# Patient Record
Sex: Female | Born: 1965 | Race: White | Hispanic: Yes | Smoking: Former smoker
Health system: Western US, Community
[De-identification: ages and names within clinical notes are randomized; demographics above are authoritative.]

## PROBLEM LIST (undated history)

## (undated) DIAGNOSIS — D049 Carcinoma in situ of skin, unspecified: Secondary | ICD-10-CM

## (undated) DIAGNOSIS — F3281 Premenstrual dysphoric disorder: Secondary | ICD-10-CM

## (undated) DIAGNOSIS — E785 Hyperlipidemia, unspecified: Secondary | ICD-10-CM

## (undated) DIAGNOSIS — O24419 Gestational diabetes mellitus in pregnancy, unspecified control: Secondary | ICD-10-CM

## (undated) DIAGNOSIS — N840 Polyp of corpus uteri: Secondary | ICD-10-CM

## (undated) DIAGNOSIS — I10 Essential (primary) hypertension: Secondary | ICD-10-CM

## (undated) DIAGNOSIS — N946 Dysmenorrhea, unspecified: Secondary | ICD-10-CM

## (undated) DIAGNOSIS — N920 Excessive and frequent menstruation with regular cycle: Secondary | ICD-10-CM

## (undated) DIAGNOSIS — T148XXA Other injury of unspecified body region, initial encounter: Secondary | ICD-10-CM

## (undated) DIAGNOSIS — Z973 Presence of spectacles and contact lenses: Secondary | ICD-10-CM

## (undated) DIAGNOSIS — C4491 Basal cell carcinoma of skin, unspecified: Secondary | ICD-10-CM

## (undated) HISTORY — DX: Other injury of unspecified body region, initial encounter: T14.8XXA

## (undated) HISTORY — DX: Basal cell carcinoma of skin, unspecified: C44.91

## (undated) HISTORY — DX: Carcinoma in situ of skin, unspecified: D04.9

## (undated) HISTORY — PX: SKIN CANCER EXCISION: SHX779

## (undated) HISTORY — DX: Gestational diabetes mellitus in pregnancy, unspecified control: O24.419

## (undated) HISTORY — DX: Essential (primary) hypertension: I10

## (undated) HISTORY — DX: Dysmenorrhea, unspecified: N94.6

## (undated) HISTORY — DX: Premenstrual dysphoric disorder: F32.81

## (undated) HISTORY — DX: Hyperlipidemia, unspecified: E78.5

## (undated) HISTORY — DX: Polyp of corpus uteri: N84.0

## (undated) HISTORY — DX: Excessive and frequent menstruation with regular cycle: N92.0

## (undated) LAB — HM MAMMOGRAPHY

---
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
## 2006-09-19 ENCOUNTER — Ambulatory Visit: Payer: Self-pay

## 2009-05-16 ENCOUNTER — Ambulatory Visit: Payer: Self-pay

## 2009-06-17 HISTORY — PX: OTHER SURGICAL HISTORY: SHX169

## 2009-10-15 HISTORY — PX: ENDOMETRIAL BIOPSY: SHX622

## 2009-11-15 HISTORY — PX: ENDOMETRIAL ABLATION W/ NOVASURE: SUR434

## 2009-11-17 ENCOUNTER — Ambulatory Visit: Payer: Self-pay | Admitting: Unknown Physician Specialty

## 2011-08-07 ENCOUNTER — Ambulatory Visit: Payer: Self-pay

## 2011-08-08 ENCOUNTER — Ambulatory Visit: Payer: Self-pay

## 2012-08-15 DIAGNOSIS — D049 Carcinoma in situ of skin, unspecified: Secondary | ICD-10-CM

## 2012-08-15 HISTORY — DX: Carcinoma in situ of skin, unspecified: D04.9

## 2012-12-30 ENCOUNTER — Emergency Department: Payer: Self-pay | Admitting: Emergency Medicine

## 2013-02-25 LAB — HM PAP SMEAR: HM Pap smear: NEGATIVE

## 2013-06-17 DIAGNOSIS — T148XXA Other injury of unspecified body region, initial encounter: Secondary | ICD-10-CM

## 2013-06-17 HISTORY — DX: Other injury of unspecified body region, initial encounter: T14.8XXA

## 2016-02-16 ENCOUNTER — Inpatient Hospital Stay: Admit: 2016-02-16 | Discharge: 2016-02-16 | Discharge status: Against Medical Advice | Payer: BC Managed Care – PPO

## 2016-02-16 DIAGNOSIS — Z711 Person with feared health complaint in whom no diagnosis is made: Secondary | ICD-10-CM

## 2016-02-16 LAB — COMPREHENSIVE METABOLIC PANEL
ALT - Alanine Aminotransferase: 39 IU/L (ref 7–52)
AST - Aspartate Aminotransferase: 37 IU/L (ref 8–39)
Albumin/Globulin Ratio: 1.6 (ref >0.9)
Albumin: 4.5 g/dL (ref 3.5–5.0)
Alkaline Phosphatase: 50 IU/L (ref 34–104)
Anion Gap: 8 mmol/L (ref 3.0–11.0)
BUN: 16 mg/dL (ref 6–23)
Bilirubin Total: 0.5 mg/dL (ref 0.3–1.2)
CO2 - Carbon Dioxide: 26 mmol/L (ref 21.0–31.0)
Calcium: 9.7 mg/dL (ref 8.6–10.3)
Chloride: 98 mmol/L (ref 98–111)
Creatinine: 0.99 mg/dL (ref 0.55–1.10)
GFR Estimate: 59 mL/min/{1.73_m2} — ABNORMAL LOW (ref >=60)
Globulin: 2.8 g/dL (ref 2.2–3.7)
Glucose: 113 mg/dL — ABNORMAL HIGH (ref 80–99)
Potassium: 4 mmol/L (ref 3.5–5.1)
Protein Total: 7.3 g/dL (ref 6.0–8.0)
Sodium: 132 mmol/L — ABNORMAL LOW (ref 135–143)

## 2016-02-16 LAB — CBC WITH AUTO DIFFERENTIAL
Basophils %: 1 % (ref 0–2)
Basophils, Absolute: 0.1 10*3/ÂµL (ref 0.0–0.2)
Eosinophils %: 2 % (ref 0–7)
Eosinophils, Absolute: 0.1 10*3/ÂµL (ref 0.0–0.7)
HCT: 40.3 % (ref 37.0–48.0)
Hemoglobin: 13.9 g/dL (ref 12.0–16.0)
Lymphocytes %: 15 % — ABNORMAL LOW (ref 25–45)
Lymphocytes, Absolute: 0.9 10*3/ÂµL — ABNORMAL LOW (ref 1.1–4.3)
MCH: 32.7 pg (ref 27.0–34.0)
MCHC: 34.4 g/dL (ref 32.0–36.0)
MCV: 95.1 fL (ref 81.0–99.0)
MPV: 9.3 fL (ref 7.4–10.4)
Monocytes %: 14 % — ABNORMAL HIGH (ref 0–12)
Monocytes, Absolute: 0.8 10*3/ÂµL (ref 0.0–1.2)
Neutrophils %: 68 % (ref 35–70)
Neutrophils, Absolute: 4.2 10*3/??L (ref 1.6–7.3)
Platelet Count: 186 10*3/ÂµL (ref 150–400)
RBC: 4.23 10*6/ÂµL (ref 4.20–5.40)
RDW: 13.7 % (ref 11.5–14.5)
WBC: 6.1 10*3/??L (ref 4.8–10.8)

## 2016-02-16 LAB — UA WITH AUTO MICRO
Ascorbic Acid, Urine: NEGATIVE
Bilirubin, Urine: NEGATIVE
Glucose, Urine: NEGATIVE mg/dL
Ketones, Urine: NEGATIVE mg/dL
Leukocyte Esterase, Urine: NEGATIVE
Nitrite, Urine: NEGATIVE
Protein, Urine: NEGATIVE mg/dL
RBC, Urine: 1 /HPF (ref <=5)
Specific Gravity, Urine: <1.003 — ABNORMAL LOW (ref 1.003–1.030)
Squamous Epithelial, Urine: 8 /HPF — ABNORMAL HIGH (ref <=5)
Urobilinogen, Urine: NORMAL mg/dL
White Blood Cell Microscopic Numeric: 1 /HPF (ref <=9)
pH, UA: 6 (ref 5.0–7.5)

## 2016-02-16 LAB — TROPONIN I: Troponin I: <0.03 ng/mL (ref <=0.04)

## 2016-02-16 LAB — POCT GLUCOSE: POC Glucose: 174 mg/dL — ABNORMAL HIGH (ref 80–99)

## 2016-02-16 NOTE — ED Triage Notes (Addendum)
Pt states a 2 hour episode of shakiness and feeling loopier and loopier.  No history of similar symptoms.  Denies headache, head injury.  No fever.  Accucheck- 174.

## 2016-02-20 NOTE — Telephone Encounter (Signed)
Patient has been scheduled to establish care with Dr. Manson Passey on Tuesday 11/14    Patient refused my offer to medically register her as she was at work and asked for her NPPW to be sent to her     NPPW and welcome packet mailed

## 2016-02-20 NOTE — Telephone Encounter (Signed)
Establish care, transferred to new patient coordinator Wanda

## 2016-04-25 NOTE — Telephone Encounter (Signed)
Patient has been abstracted from her NPPW

## 2016-04-30 ENCOUNTER — Ambulatory Visit: Admit: 2016-04-30 | Discharge: 2016-04-30 | Payer: BC Managed Care – PPO | Attending: MD

## 2016-04-30 DIAGNOSIS — Z780 Asymptomatic menopausal state: Secondary | ICD-10-CM

## 2016-04-30 NOTE — Progress Notes (Signed)
Chief Complaint   Patient presents with   . Fatigue       Current Visit Diagnoses:    ICD-9-CM ICD-10-CM   1. Menopause 627.2 Z78.0   2. Chronic fatigue 780.79 R53.82   3. Screening for lipid disorders V77.91 Z13.220   4. Screening mammogram, encounter for V76.12 Z12.31   5. Encounter for screening fecal occult blood testing V76.51 Z12.11   6. Encounter to establish care V65.8 Z76.89   7. BMI 25.0-25.9,adult V85.21 Z68.25   8. Former smoker V15.82 Z87.891       SUBJECTIVE   Tamara Stout is a 50 y.o. female who presents today for Fatigue      Other Patient Care Team Members    HISTORY OF PRESENT ILLNESS     HPI  Pt is here today to establish care with a new provider.  Grew up here, spent 10 years in [place] and then returned about 1-2 years ago.  Lost her husband about 2 years ago to lung cancer.  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  Never had children.  Lives with her 12yo nephew.  Really has no medical problems.  Has always been a very nervous person.  Found her mother dead at 4.  Father was very angry.  She just breathes and prays to cope.  Has not had a period in 7-8 months.  Has been feeling very fatigued, gaining weight, dry skin and hair.  Occasional hot flashes.  Sits all day at work, hardly eats - tuna and salads.  Is working out at home with her nephew recently.  No medications - takes a MVI and Ca++ when she remembers.  Due for a mammogram.  Last pap was a few years ago as well.  Had some abnormal cells when she was young, but all f/u was normal.  Had a colonoscopy at 28 for rectal bleeding.  Normal.  Not sure she wants to update that.  Will do stool cards.  Had her flu shot.  Tdap is UTD.  Five pack smoking history, but lots of second hand smoke.  Does have a glass of wine daily, maybe more on the weekends.    The following portions of the patient's history were reviewed and updated as appropriate: allergies, current medications, past family history, past medical history, past  surgical history and problem list.    Outpatient Prescriptions Marked as Taking for the 04/30/16 encounter (Office Visit) with Maleke Feria Norva Pavlov, MD   Medication Sig Dispense Refill   . MV,CALCIUM,MIN/IRON/FOLIC/VITK (MULTI FOR HER ORAL) Take by mouth daily.         Past Medical History:   Diagnosis Date   . Anxiety     Since childhood    . Phlebitis 2017       Past Surgical History:   Procedure Laterality Date   . FRACTURE SURGERY  1982       Family History   Problem Relation Age of Onset   . Early death Mother    . Heart disease Mother    . Cancer Father    . Early death Father    . Mental retardation Maternal Uncle    . Cancer Maternal Grandmother    . Alcohol abuse Maternal Grandfather    . Diabetes Paternal Grandmother        Social History     Social History Main Topics   . Smoking status: Former Smoker     Quit date: 2010   .  Smokeless tobacco: Never Used   . Alcohol use 4.2 oz/week     7 Glasses of wine per week   . Drug use: No   . Sexual activity: No     Social History   . Marital status: Widowed     Spouse name: N/A   . Number of children: N/A   . Years of education: 28     Other Topics Concern   . Caffeine Concern Yes       PHQ-9 Total Score: 0    Health Maintenance       Date Due Completion Dates    CERVICAL CANCER SCREENING 05/05/1986 ---    BREAST CANCER SCREENING 06/17/2012 06/17/2010    COLORECTAL CANCER SCREENING 05/06/2015 ---            REVIEW OF SYSTEMS     Review of Systems   Constitutional: Positive for fatigue and unexpected weight change. Negative for activity change, appetite change, chills and fever.   HENT: Negative for congestion, postnasal drip, sinus pressure, sore throat and trouble swallowing.    Eyes: Negative for pain, itching and visual disturbance.   Respiratory: Negative for cough, chest tightness and shortness of breath.    Cardiovascular: Negative for chest pain, palpitations and leg swelling.   Gastrointestinal: Negative for abdominal pain, blood in stool, constipation and  diarrhea.   Endocrine: Negative for cold intolerance, heat intolerance and polyuria.   Genitourinary: Negative for difficulty urinating, dysuria, frequency and urgency.   Musculoskeletal: Negative for back pain and neck pain.   Skin: Negative for rash and wound.   Allergic/Immunologic: Negative for environmental allergies.   Neurological: Negative for dizziness, speech difficulty, weakness, light-headedness, numbness and headaches.   Hematological: Does not bruise/bleed easily.   Psychiatric/Behavioral: Negative for dysphoric mood and sleep disturbance. The patient is nervous/anxious.        OBJECTIVE     Vitals:    04/30/16 0800   BP: 130/88   Pulse: 88   Resp: 16   Temp: 36.9 ?C (98.5 ?F)   SpO2: 97%     Body mass index is 25.15 kg/m?Marland Kitchen     Results for orders placed or performed in visit on 04/25/16   HM MAMMOGRAPHY   Result Value Ref Range    HM Mammogram Unknown Location         PHYSICAL EXAM     Physical Exam   Constitutional: She is well-developed, well-nourished, and in no distress.   HENT:   Head: Normocephalic and atraumatic.   Right Ear: Tympanic membrane, external ear and ear canal normal.   Left Ear: Tympanic membrane, external ear and ear canal normal.   Mouth/Throat: Oropharynx is clear and moist.   Eyes: EOM are normal.   Neck: Neck supple. No thyromegaly present.   Cardiovascular: Normal rate, regular rhythm and normal heart sounds.    No murmur heard.  Pulmonary/Chest: Effort normal and breath sounds normal. She has no wheezes.   Abdominal: Soft. Bowel sounds are normal. There is no tenderness.   Musculoskeletal: She exhibits no edema.   Lymphadenopathy:     She has no cervical adenopathy.        Right: No supraclavicular adenopathy present.        Left: No supraclavicular adenopathy present.   Neurological: She is alert.   Skin: Skin is warm and dry.   Psychiatric: Mood and affect normal.       ASSESSMENT & PLAN     1. Menopause  - Estradiol -  Routine; Future  - Luteinizing Hormone -Routine;  Future  - Follicle Stimulating Hormone -Routine; Future  , At this visit, patient feels its overall not too bad so far. We'll get some baseline hormone levels and treat supportively in the meantime.  2. Chronic fatigue  - 25-OH Vitamin D Total D2+D3 -Routine; Future  - Thyroid Stimulating Hormone -Routine; Future  - Comprehensive Metabolic Panel -Routine; Future  - CBC with Auto Differential -Routine; Future  Likely multifactorial. Menopause surely contributing. Encouraged healthy diet, regular exercise. Update blood work to ensure no abnormalities.  3. Screening for lipid disorders  - Thyroid Stimulating Hormone -Routine; Future  - Coronary Risk Lipid Panel -Routine; Future    4. Screening mammogram, encounter for  - Mammography tomo screening bilateral digital w cad; Future    5. Encounter for screening fecal occult blood testing  - Occult Bld, Stool Screen; Future    6. Encounter to establish care    7. BMI 25.0-25.9,adult    8. Former smoker      Return in about 6 months (around 10/28/2016) for labs.    There are no discontinued medications.    ----------------------------------------------------------------------------------------------------------------------    We had a thoughtful and careful discussion regarding the issues today. All questions were answered. We educated and reassured. We encouraged followup as needed.     This note was transcribed using speech recognition software. Please contact us for clarification if any questions arise relating to the wording of this document.    I, Standley Brooking, personally performed the services described in this documentation and it is both accurate and complete to the best of my knowledge.

## 2016-05-30 ENCOUNTER — Ambulatory Visit: Payer: BC Managed Care – PPO

## 2016-07-03 ENCOUNTER — Inpatient Hospital Stay: Admit: 2016-07-03 | Payer: BC Managed Care – PPO | Attending: MD

## 2016-07-03 DIAGNOSIS — Z1231 Encounter for screening mammogram for malignant neoplasm of breast: Secondary | ICD-10-CM

## 2017-01-29 ENCOUNTER — Emergency Department
Admission: EM | Admit: 2017-01-29 | Discharge: 2017-01-29 | Disposition: A | Discharge status: Home / Self Care | Payer: Federal, State, Local not specified - PPO | Attending: Emergency Medicine | Admitting: Emergency Medicine

## 2017-01-29 ENCOUNTER — Encounter: Payer: Self-pay | Admitting: Emergency Medicine

## 2017-01-29 DIAGNOSIS — T7840XA Allergy, unspecified, initial encounter: Secondary | ICD-10-CM | POA: Diagnosis present

## 2017-01-29 MED ORDER — FAMOTIDINE IN NACL 20-0.9 MG/50ML-% IV SOLN
20.0000 mg | Freq: Once | INTRAVENOUS | Status: AC
  Administered 2017-01-29: 20 mg via INTRAVENOUS
  Filled 2017-01-29: qty 50

## 2017-01-29 MED ORDER — METHYLPREDNISOLONE SODIUM SUCC 125 MG IJ SOLR
125.0000 mg | Freq: Once | INTRAMUSCULAR | Status: AC
  Administered 2017-01-29: 125 mg via INTRAVENOUS
  Filled 2017-01-29: qty 2

## 2017-01-29 MED ORDER — EPINEPHRINE 0.3 MG/0.3ML IJ SOAJ: 0.3000 mg | Freq: Once | INTRAMUSCULAR | 0 refills | Status: AC

## 2017-01-29 MED ORDER — DIPHENHYDRAMINE HCL 50 MG/ML IJ SOLN
50.0000 mg | Freq: Once | INTRAMUSCULAR | Status: AC
  Administered 2017-01-29: 50 mg via INTRAVENOUS
  Filled 2017-01-29: qty 1

## 2017-01-29 NOTE — ED Triage Notes (Addendum)
Patient ambulatory to triage with steady gait, without difficulty or distress noted; pt reports awakening with sensation of throat closing and swelling to tongue; st earlier in the week similar episode after drinking white wine; voices slightly muffled

## 2017-01-29 NOTE — ED Notes (Addendum)
Pt drank white wine around 7:00pm tonight, went to bed around 10:00pm and woke up with swollen throat. She vomited and states that she "cant swallow". She said it happened earlier in the week but not this bad.

## 2017-01-29 NOTE — ED Provider Notes (Signed)
[facility] Emergency Department Provider Note    First MD Initiated Contact with Patient 01/29/17 0110     (approximate)  I have reviewed the triage vital signs and the nursing notes.   HISTORY  Chief Complaint Allergic Reaction    HPI Crystal Li is a 51 y.o. female presents emergency Department with history of awakening with "sensation of my throat closing and tongue swelling". Patient denies any known allergen. States that she had a similar episode this week following drinking white wine. Patient does admit to drinking one before going to bed last night as well as eati Patient denies any pruritus no chest pain no shortness of breath.   Past Medical history None There are no active problems to display for this patient.   Past surgical history one Prior to Admission medications   Not on File    Allergies No known drug allergies No family history on file.  Social History Social History  Substance Use Topics  . Smoking status: Never Smoker  . Smokeless tobacco: Never Used  . Alcohol use Yes    Review of Systems Constitutional: No fever/chills Eyes: No visual changes. ENT: No sore throat.Positive for difficulty swallowing Cardiovascular: Denies chest pain. Respiratory: Denies shortness of breath. Gastrointestinal: No abdominal pain.  No nausea, no vomiting.  No diarrhea.  No constipation. Genitourinary: Negative for dysuria. Musculoskeletal: Negative for neck pain.  Negative for back pain. Integumentary: Negative for rash. Neurological: Negative for headaches, focal weakness or numbness.  ____________________________________________   PHYSICAL EXAM:  VITAL SIGNS: ED Triage Vitals  Enc Vitals Group     BP 01/29/17 0104 (!) 148/95     Pulse Rate 01/29/17 0104 (!) 120     Resp 01/29/17 0104 20     Temp 01/29/17 0104 98 F (36.7 C)     Temp Source 01/29/17 0104 Oral     SpO2 01/29/17 0104 97 %     Weight 01/29/17 0103 59.9  kg (132 lb)     Height 01/29/17 0103 1.524 m (5')     Head Circumference --      Peak Flow --      Pain Score --      Pain Loc --      Pain Edu? --      Excl. in [place]? --     Constitutional: Alert and oriented. Well appearing and in no acute distress. Eyes: Conjunctivae are normal.  Head: Atraumatic. Mouth/Throat: Mucous membranes are moist. Oropharynx non-erythematous. Neck: No stridor.   Cardiovascular: Normal rate, regular rhythm. Good peripheral circulation. Grossly normal heart sounds. Respiratory: Normal respiratory effort.  No retractions. Lungs CTAB. Gastrointestinal: Soft and nontender. No distention.  Musculoskeletal: No lower extremity tenderness nor edema. No gross deformities of extremities. Neurologic:  Normal speech and language. No gross focal neurologic deficits are appreciated.  Skin:  Skin is warm, dry and intact. No rash noted. Psychiatric: Mood and affect are normal. Speech and behavior are normal.     Procedures   ____________________________________________   INITIAL IMPRESSION / ASSESSMENT AND PLAN / ED COURSE  Pertinent labs & imaging results that were available during my care of the patient were reviewed by me and considered in my medical decision making (see chart for details).  Patient given Benadryl 50 mg I'm Medrol 125 mg and Pepcid 20 mg a complete resolution of symptoms. Patient reevaluated posterior oropharynx clear. No rash. Spoke with the patient and her husband at length regarding allergic reactions and the need  to follow-up with Dr. Kathyrn Sheriff for further outpatient evaluation and management. In addition spoke with the patient regarding the EpiPen      ____________________________________________  FINAL CLINICAL IMPRESSION(S) / ED DIAGNOSES  Final diagnoses:  Allergic reaction, initial encounter     MEDICATIONS GIVEN DURING THIS VISIT:  Medications  methylPREDNISolone sodium succinate (SOLU-MEDROL) 125 mg/2 mL injection 125 mg (not  administered)  diphenhydrAMINE (BENADRYL) injection 50 mg (not administered)  famotidine (PEPCID) IVPB 20 mg premix (not administered)     NEW OUTPATIENT MEDICATIONS STARTED DURING THIS VISIT:  New Prescriptions   No medications on file    Modified Medications   No medications on file    Discontinued Medications   No medications on file     Note:  This document was prepared using Dragon voice recognition software and may include unintentional dictation errors.    Gregor Hams, MD 01/29/17 [phone]

## 2017-01-29 NOTE — ED Notes (Signed)
Patient presents to the ED for complaints of allergic reaction to white wine. Had two glasses of wine tonight and went to bed and states throat pain woke her up. States her "throat is closed." Has received two cell phone calls at the desk and was able to speak clearly on the first call; decided not to answer the second call.

## 2017-06-10 ENCOUNTER — Other Ambulatory Visit: Payer: Self-pay | Admitting: Obstetrics and Gynecology

## 2017-07-10 ENCOUNTER — Encounter: Payer: Self-pay | Admitting: Obstetrics and Gynecology

## 2017-07-10 ENCOUNTER — Ambulatory Visit (INDEPENDENT_AMBULATORY_CARE_PROVIDER_SITE_OTHER): Payer: Federal, State, Local not specified - PPO | Admitting: Obstetrics and Gynecology

## 2017-07-10 VITALS — BP 144/88 | HR 96 | Ht 59.0 in | Wt 134.0 lb

## 2017-07-10 DIAGNOSIS — Z01419 Encounter for gynecological examination (general) (routine) without abnormal findings: Secondary | ICD-10-CM | POA: Diagnosis not present

## 2017-07-10 DIAGNOSIS — Z124 Encounter for screening for malignant neoplasm of cervix: Secondary | ICD-10-CM

## 2017-07-10 DIAGNOSIS — Z1231 Encounter for screening mammogram for malignant neoplasm of breast: Secondary | ICD-10-CM

## 2017-07-10 DIAGNOSIS — F419 Anxiety disorder, unspecified: Secondary | ICD-10-CM

## 2017-07-10 DIAGNOSIS — Z1151 Encounter for screening for human papillomavirus (HPV): Secondary | ICD-10-CM | POA: Diagnosis not present

## 2017-07-10 DIAGNOSIS — Z1211 Encounter for screening for malignant neoplasm of colon: Secondary | ICD-10-CM

## 2017-07-10 DIAGNOSIS — Z1239 Encounter for other screening for malignant neoplasm of breast: Secondary | ICD-10-CM

## 2017-07-10 DIAGNOSIS — Z8041 Family history of malignant neoplasm of ovary: Secondary | ICD-10-CM

## 2017-07-10 LAB — HEMOCCULT GUIAC POC 1CARD (OFFICE): Fecal Occult Blood, POC: NEGATIVE

## 2017-07-10 MED ORDER — ALPRAZOLAM 0.5 MG PO TABS: 0.5000 mg | ORAL_TABLET | Freq: Two times a day (BID) | ORAL | 0 refills | Status: DC | PRN

## 2017-07-10 NOTE — Patient Instructions (Addendum)
I value your feedback and entrusting us with your care. If you get a [facility] patient survey, I would appreciate you taking the time to let us know about your experience today. Thank you!  [facility]: [phone]  [facility]: [phone]  

## 2017-07-10 NOTE — Progress Notes (Addendum)
PCP: [place]   Chief Complaint  Patient presents with  . Gynecologic Exam    HPI:      [name] is a 52 y.o. [phone] who LMP was No LMP recorded. Patient is postmenopausal., presents today for her annual examination.  Her menses are absent due to menopause. She does not have intermenstrual bleeding. She does have vasomotor sx, tolerable.  Sex activity: single partner, contraception - post menopausal status. She does not have vaginal dryness.  Last Pap: 09/21/15 Results were: no abnormalities /neg HPV DNA (results not available at appt, so pap done again). Hx of STDs: none  Last mammogram: September 21, 2015  Results were: normal--routine follow-up in 12 months There is no FH of breast cancer. There is a FH of ovarian cancer in her mom. Her sister is BRCA neg. Pt has [facility] ins and doesn't meet their testing guidelines. The patient does do self-breast exams.  She had neg u/s and ca-125 4/17.  Colonoscopy: never.   Tobacco use: The patient denies current or previous tobacco use. Alcohol use: social drinker Exercise: moderately active  She does get adequate calcium and Vitamin D in her diet.  Labs with PCP. Pt needs Rx RF xanax to take sparingly.    Past Medical History:  Diagnosis Date  . Basal cell carcinoma   . Dysmenorrhea   . Endometrial polyp   . Fracture 2015   Left heel  . Gestational diabetes   . Hyperlipidemia   . Hypertension   . Menorrhagia   . PMDD (premenstrual dysphoric disorder)   . 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 CRYOTHERAPY  1985  . ENDOMETRIAL ABLATION W/ NOVASURE  11/2009  . ENDOMETRIAL BIOPSY  10/2009  . hysteroscopy D&C  2011  . SKIN CANCER EXCISION      Family History  Problem Relation Age of Onset  . Ovarian cancer Mother 61       2014, insurance would not cover testing. Sister is BRCA Neg  . Hypertension Mother   .  Hypertension Father     Social History   Socioeconomic History  . Marital status: Married    Spouse name: Not on file  . Number of children: Not on file  . Years of education: Not on file  . Highest education level: Not on file  Social Needs  . Financial resource strain: Not on file  . Food insecurity - worry: Not on file  . Food insecurity - inability: Not on file  . Transportation needs - medical: Not on file  . Transportation needs - non-medical: Not on file  Occupational History  . Not on file  Tobacco Use  . Smoking status: Never Smoker  . Smokeless tobacco: Never Used  Substance and Sexual Activity  . Alcohol use: Yes  . Drug use: No  . Sexual activity: Yes    Birth control/protection: None  Other Topics Concern  . Not on file  Social History Narrative  . Not on file    Current Meds  Medication Sig  . ALPRAZolam (XANAX) 0.5 MG tablet Take 1 tablet (0.5 mg total) by mouth 2 (two) times daily as needed for anxiety.  Marland Kitchen amLODipine (NORVASC) 5 MG tablet TAKE 1 TABLET BY MOUTH EVERY DAY IN THE MORNING  . atorvastatin (LIPITOR) 10 MG tablet TAKE 1 TABLET BY MOUTH EVERYDAY AT BEDTIME  . chlorthalidone (  HYGROTON) 25 MG tablet chlorthalidone 25 mg tablet  . [DISCONTINUED] ALPRAZolam (XANAX) 0.5 MG tablet alprazolam 0.5 mg tablet      ROS:  Review of Systems  Constitutional: Negative for fatigue, fever and unexpected weight change.  Respiratory: Negative for cough, shortness of breath and wheezing.   Cardiovascular: Negative for chest pain, palpitations and leg swelling.  Gastrointestinal: Negative for blood in stool, constipation, diarrhea, nausea and vomiting.  Endocrine: Negative for cold intolerance, heat intolerance and polyuria.  Genitourinary: Negative for dyspareunia, dysuria, flank pain, frequency, genital sores, hematuria, menstrual problem, pelvic pain, urgency, vaginal bleeding, vaginal discharge and vaginal pain.  Musculoskeletal: Negative for back pain,  joint swelling and myalgias.  Skin: Negative for rash.  Neurological: Negative for dizziness, syncope, light-headedness, numbness and headaches.  Hematological: Negative for adenopathy.  Psychiatric/Behavioral: Negative for agitation, confusion, sleep disturbance and suicidal ideas. The patient is not nervous/anxious.      Objective: BP (!) 144/88 (BP Location: Left Arm, Patient Position: Sitting, Cuff Size: Normal)   Pulse 96   Ht 4' 11"  (1.499 m)   Wt 134 lb (60.8 kg)   BMI 27.06 kg/m    Physical Exam  Constitutional: She is oriented to person, place, and time. She appears well-developed and well-nourished.  Genitourinary: Rectum normal, vagina normal and uterus normal. There is no rash or tenderness on the right labia. There is no rash or tenderness on the left labia. No erythema or tenderness in the vagina. No vaginal discharge found. Right adnexum does not display mass and does not display tenderness. Left adnexum does not display mass and does not display tenderness. Cervix does not exhibit motion tenderness or polyp. Uterus is not enlarged or tender. Rectal exam shows no fissure, no tenderness and guaiac negative stool.  Neck: Normal range of motion. No thyromegaly present.  Cardiovascular: Normal rate, regular rhythm and normal heart sounds.  No murmur heard. Pulmonary/Chest: Effort normal and breath sounds normal. Right breast exhibits no mass, no nipple discharge, no skin change and no tenderness. Left breast exhibits no mass, no nipple discharge, no skin change and no tenderness.  Abdominal: Soft. There is no tenderness. There is no guarding.  Musculoskeletal: Normal range of motion.  Neurological: She is alert and oriented to person, place, and time. No cranial nerve deficit.  Psychiatric: She has a normal mood and affect. Her behavior is normal.  Vitals reviewed.   Results: Results for orders placed or performed in visit on 07/10/17 (from the past 24 hour(s))  POCT  Occult Blood Stool     Status: Normal   Collection Time: 07/10/17  8:59 AM  Result Value Ref Range   Fecal Occult Blood, POC Negative Negative   Card #1 Date     Card #2 Fecal Occult Blod, POC     Card #2 Date     Card #3 Fecal Occult Blood, POC     Card #3 Date      Assessment/Plan:  Encounter for annual routine gynecological examination  Cervical cancer screening - Plan: IGP, Aptima HPV  Screening for HPV (human papillomavirus) - Plan: IGP, Aptima HPV  Screening for breast cancer - Pt to sched mammo. - Plan: MM DIGITAL SCREENING BILATERAL  Family history of ovarian cancer - Doesn't meet her ins criteria for genetic testing guidelines. Check ca-125 today. Pt declines u/s for now. Pt aware no great way to screen for ovar ca. - Plan: CA 125  Anxiety - Rx xanax RF. Pt takes sparingly. - Plan: ALPRAZolam Duanne Moron)  0.5 MG tablet  Screening for colon cancer - Neg FOBT. Recommended scr colonoscopy but pt not interested. Discussed cologard--pt to check with her ins re: coverage and f/u prn.  - Plan: POCT Occult Blood Stool   Meds ordered this encounter  Medications  . ALPRAZolam (XANAX) 0.5 MG tablet    Sig: Take 1 tablet (0.5 mg total) by mouth 2 (two) times daily as needed for anxiety.    Dispense:  30 tablet    Refill:  0            GYN counsel breast self exam, mammography screening, menopause, adequate intake of calcium and vitamin D, diet and exercise    F/U  Return in about 1 year (around 07/10/2018).  Azriel Dancy B. Trevell Pariseau, PA-C 07/10/2017 9:01 AM

## 2017-07-11 LAB — CA 125: CANCER ANTIGEN (CA) 125: 11.6 U/mL (ref 0.0–38.1)

## 2017-07-12 LAB — IGP, APTIMA HPV
HPV APTIMA: NEGATIVE
PAP SMEAR COMMENT: 0

## 2017-07-30 DIAGNOSIS — Z1231 Encounter for screening mammogram for malignant neoplasm of breast: Secondary | ICD-10-CM

## 2017-07-31 ENCOUNTER — Inpatient Hospital Stay: Admit: 2017-07-31 | Payer: BC Managed Care – PPO | Attending: MD

## 2017-08-05 ENCOUNTER — Encounter: Payer: Self-pay | Admitting: Obstetrics and Gynecology

## 2017-08-05 ENCOUNTER — Ambulatory Visit
Admission: RE | Admit: 2017-08-05 | Discharge: 2017-08-05 | Disposition: A | Discharge status: Home / Self Care | Payer: Federal, State, Local not specified - PPO | Source: Ambulatory Visit | Attending: Obstetrics and Gynecology | Admitting: Obstetrics and Gynecology

## 2017-08-05 DIAGNOSIS — Z1239 Encounter for other screening for malignant neoplasm of breast: Secondary | ICD-10-CM

## 2017-08-05 DIAGNOSIS — Z1231 Encounter for screening mammogram for malignant neoplasm of breast: Secondary | ICD-10-CM | POA: Insufficient documentation

## 2017-08-07 ENCOUNTER — Other Ambulatory Visit: Payer: Self-pay | Admitting: *Deleted

## 2017-08-07 ENCOUNTER — Inpatient Hospital Stay
Admission: RE | Admit: 2017-08-07 | Discharge: 2017-08-07 | Disposition: A | Discharge status: Home / Self Care | Payer: Self-pay | Source: Ambulatory Visit | Attending: *Deleted | Admitting: *Deleted

## 2017-08-07 DIAGNOSIS — Z9289 Personal history of other medical treatment: Secondary | ICD-10-CM

## 2018-01-12 ENCOUNTER — Encounter: Admit: 2018-01-12 | Discharge: 2018-01-12 | Payer: BC Managed Care – PPO

## 2018-08-13 IMAGING — MG MM DIGITAL SCREENING BILAT W/ TOMO W/ CAD
8 of 13 series · 8 of 29 positions shown · non-contrast
Comparison: Previous exam(s).

CLINICAL DATA: Screening.

EXAM:
DIGITAL SCREENING BILATERAL MAMMOGRAM WITH TOMO AND CAD

[L MLO (1 of 2)]
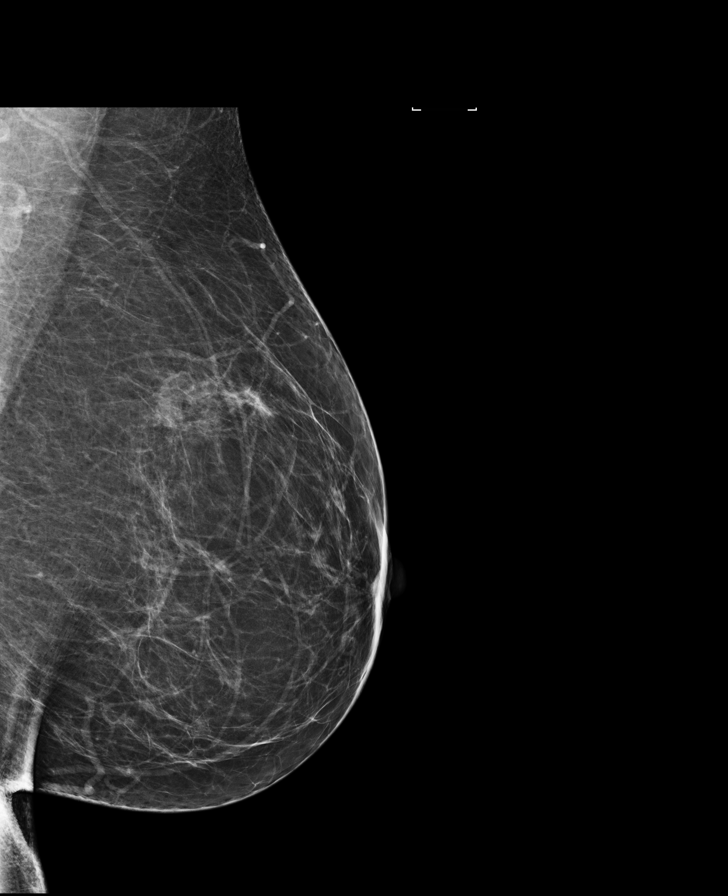

[R MLO synth-2D]
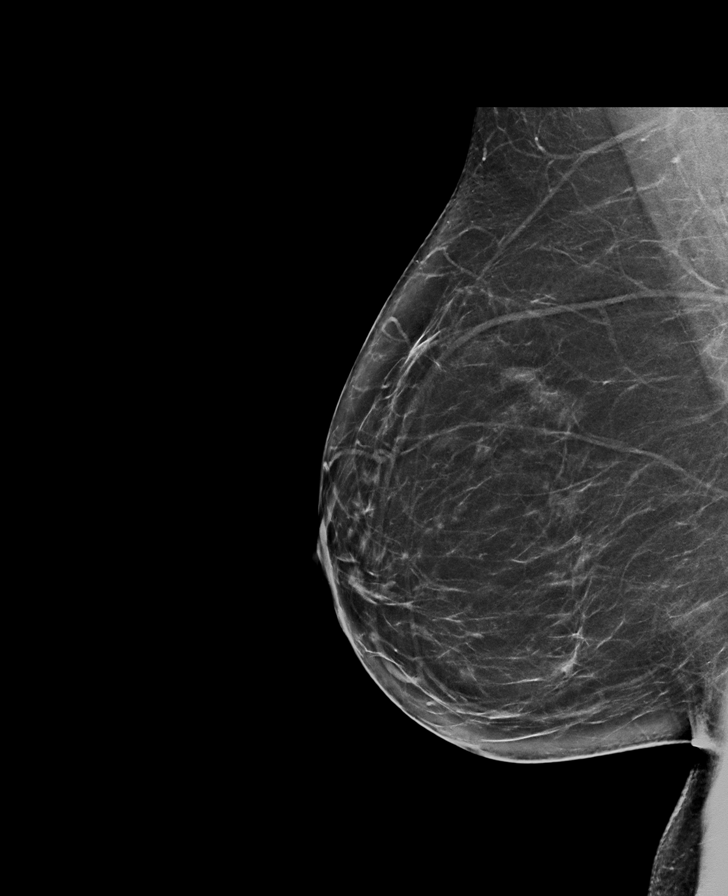

[L MLO (2 of 2)]
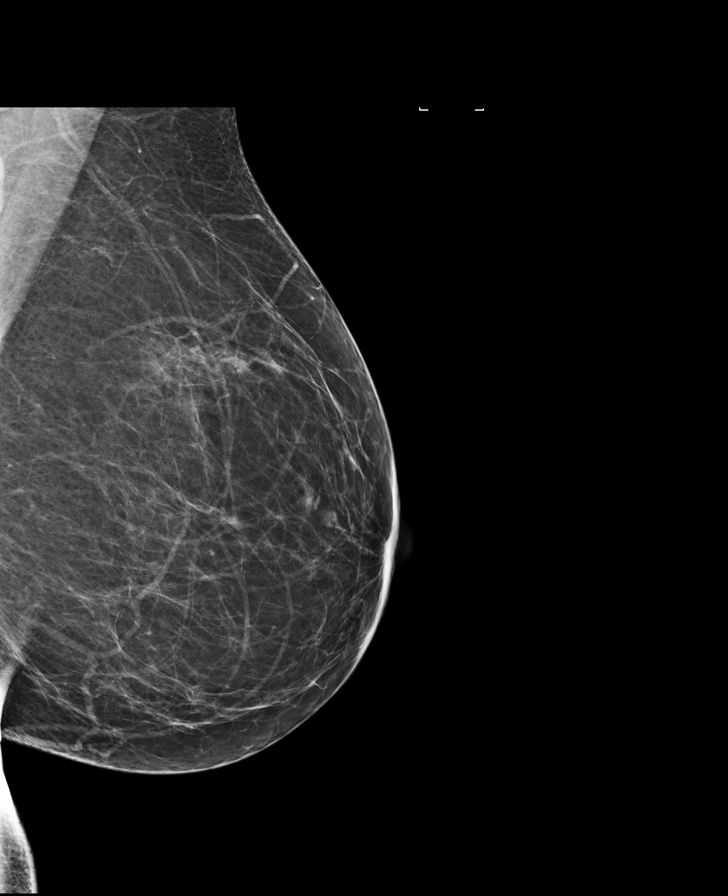

[L CC]
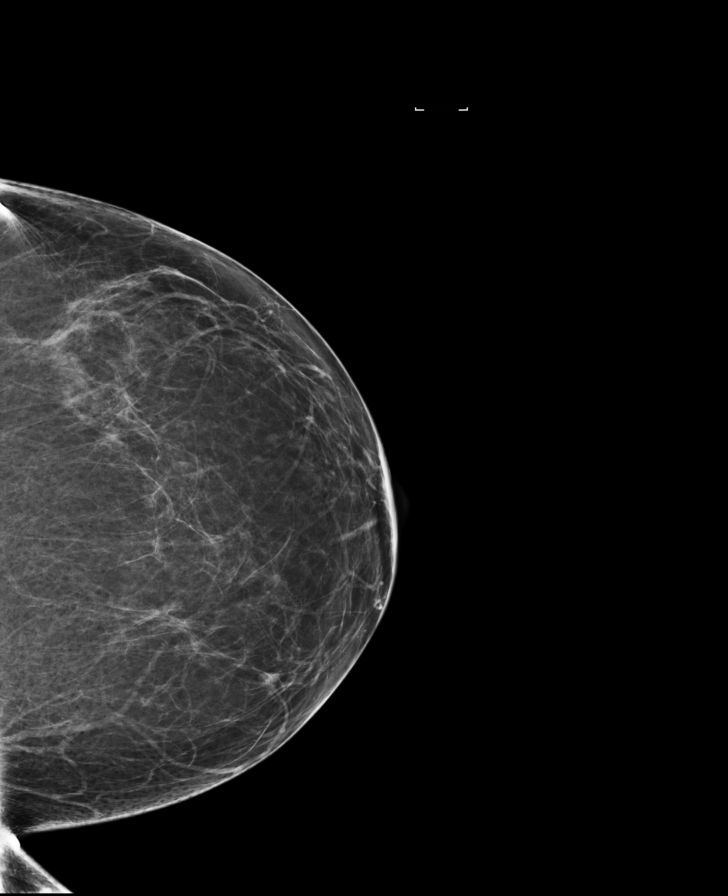

[R CC synth-2D]
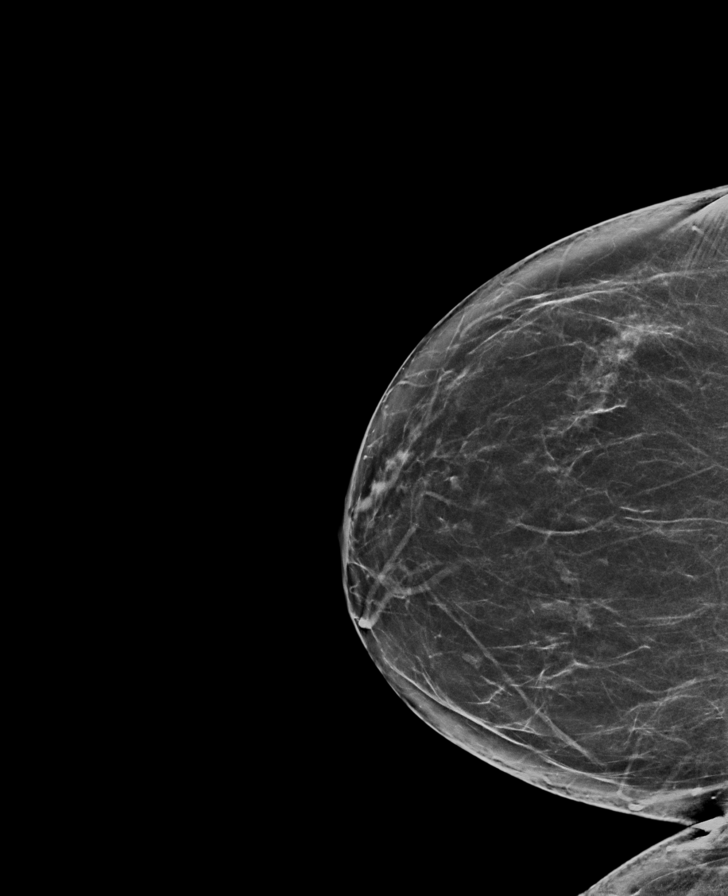

[L MLO synth-2D]
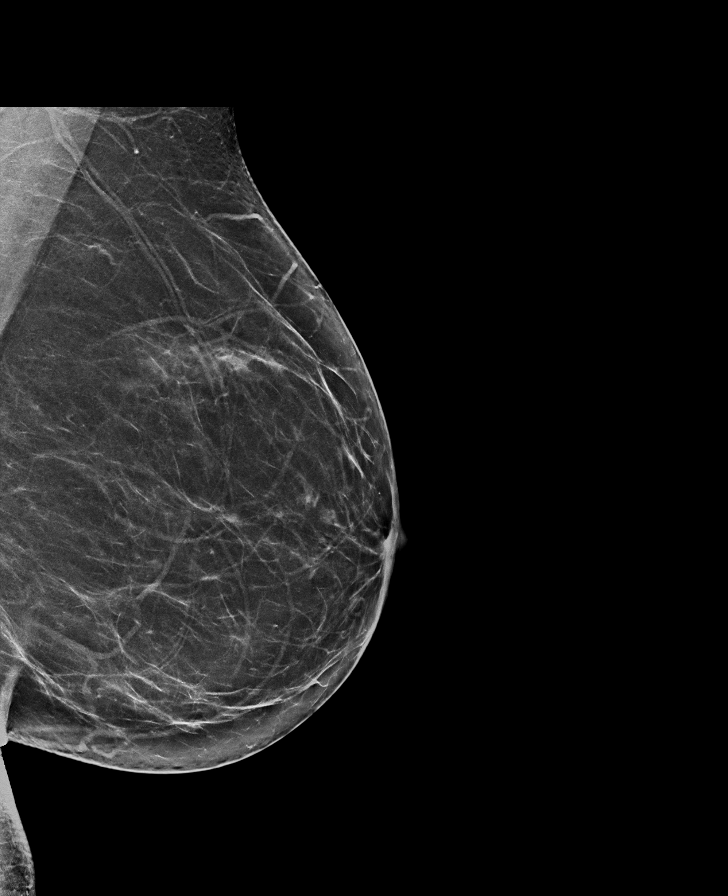

[L CC synth-2D]
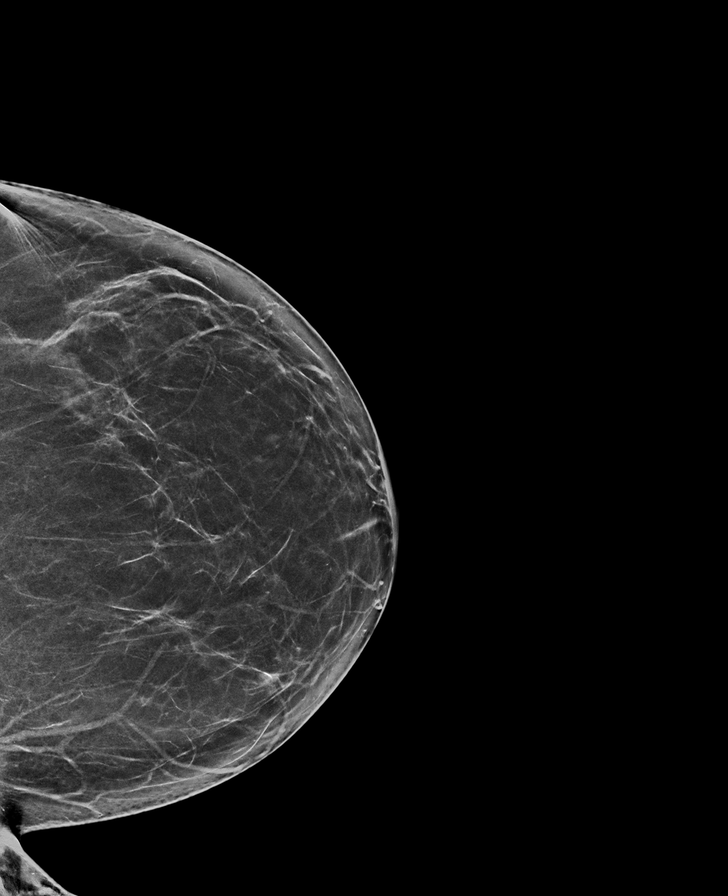

[R MLO]
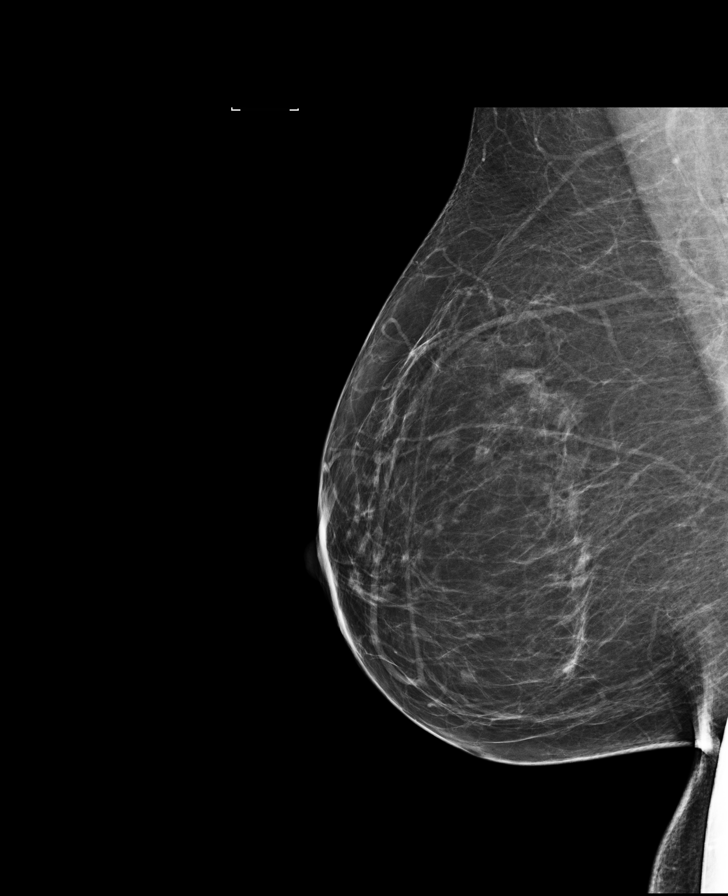

[8 of 29 positions shown; findings below may reference images not displayed]

ACR Breast Density Category b: There are scattered areas of
fibroglandular density.
FINDINGS: There are no findings suspicious for malignancy. Images were
processed with CAD.
IMPRESSION: No mammographic evidence of malignancy. A result letter of this
screening mammogram will be mailed directly to the patient.

RECOMMENDATION:
Screening mammogram in one year. (Code:[id number])

BI-RADS CATEGORY  1: Negative.

## 2018-09-19 NOTE — Telephone Encounter (Signed)
Name of Caller:    Larwance Sachs    Call back number: [phone]    Does patient need an interpreter? No     Is patient residing in a Skilled Nursing or Assisted Living Facility? No    Have you traveled internationally or been in close personal contact with someone who has? No    In the last month, have you been in contact with someone who was confirmed or suspected to have Coronavirus/COVID-19?  No    Have you been in contact with someone who is sick? No     Do you have a cough, fever, sore throat, or shortness of breath? 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

## 2018-09-19 NOTE — Telephone Encounter (Signed)
Reason for Call: COVID-19 Questions.     Background: Pt reports she has been sick on and off for 6 weeks. 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 She stated her employer told her she needed to be seen. She is wanting to go in to urgent care to get treatment.     Assessment: Pt frustrated, sounds congested, coughing and needing to blow her nose while on the phone.     Have you traveled internationally, traveled within the [place] or been in close personal contact with someone who has? no    In the last month, have you been in contact with someone who was confirmed or suspected to have Coronavirus / COVID-19? no    Does patient have SOB, Fever, Sore Throat or Cough? Yes  Cough- Yes    Plan:   I tried to recommend patient do a MyChart Video visit, she stated she has a mychart account. I explained it is important to stay home if possible d/t her current illness. Unfortunately at this time the Video Visits are not working however Epic is diligently working on getting them back up and running. I attempted to explain how the video visit would work and how to sign up so that patients concerns can be addressed later today however pt stated I know, have a good day and hung up the phone.   I was unable to discuss symptoms further or address education information below.     Disposition:     Video Visit     If patient was transferred for MyChart assistance can patient wait until end of day for video visit? n/a    Testing Ordered No      Education:     Self-isolation encouraged at home until symptoms resolve, remain fever-free for 72 hours without use of fever reducing agents and at least seven days have past since your symptoms first appeared.     Disinfect high touch surfaces daily per CDC guidelines. Wash your hands frequently with soap and water or hand sanitizer with at least 60% alcohol. Avoid touching your eyes, nose and mouth with unwashed hands. Do not  prepare food for others. Try to stay at least 6 feet away from other people. Rest and keep hydrated.     Provided patient with the following information resources: www.[place].org, www.[employer].de. Educated patient to call 211 in the event patient does not have access to the Internet. Provided education to patient regarding proper hand hygiene and preventive measures per CDC guidelines.     Patient will return call to COVID-19 [phone] with any new or worsening symptoms and/or additional questions or concerns.

## 2018-11-04 NOTE — Telephone Encounter (Signed)
Left message for Rhea Belton regarding a follow up appointment with Dr. Manson Passey.     Thank you,  Alfred Levins, LPN

## 2019-03-24 ENCOUNTER — Other Ambulatory Visit: Payer: Self-pay | Admitting: Obstetrics and Gynecology

## 2019-04-06 ENCOUNTER — Telehealth: Payer: Self-pay

## 2019-04-06 DIAGNOSIS — Z1231 Encounter for screening mammogram for malignant neoplasm of breast: Secondary | ICD-10-CM

## 2019-04-06 NOTE — Telephone Encounter (Signed)
Pt requesting an order for mammo be sent to [facility]. Pt needs appt before this can be done. Please schedule

## 2019-04-06 NOTE — Telephone Encounter (Signed)
Order placed. Pt can call [facility] directly to schedule it. Thx.

## 2019-04-06 NOTE — Telephone Encounter (Signed)
Patient is schedule for annual [birth year]. Which patient states its her birthday. Patient is asking she you would place order for her to be able to schedule her mammogram. Please advise

## 2019-04-07 NOTE — Telephone Encounter (Signed)
Pt aware.

## 2019-05-06 ENCOUNTER — Ambulatory Visit: Payer: Federal, State, Local not specified - PPO | Admitting: Obstetrics and Gynecology

## 2019-06-30 ENCOUNTER — Ambulatory Visit
Admission: RE | Admit: 2019-06-30 | Discharge: 2019-06-30 | Disposition: A | Discharge status: Home / Self Care | Payer: Federal, State, Local not specified - PPO | Source: Ambulatory Visit | Attending: Obstetrics and Gynecology | Admitting: Obstetrics and Gynecology

## 2019-06-30 ENCOUNTER — Encounter: Payer: Self-pay | Admitting: Obstetrics and Gynecology

## 2019-06-30 DIAGNOSIS — Z1231 Encounter for screening mammogram for malignant neoplasm of breast: Secondary | ICD-10-CM | POA: Diagnosis not present

## 2019-08-16 DIAGNOSIS — F419 Anxiety disorder, unspecified: Secondary | ICD-10-CM | POA: Insufficient documentation

## 2019-08-16 NOTE — Progress Notes (Signed)
PCP: [facility]   Chief Complaint  Patient presents with  . Gynecologic Exam    HPI:      [name] is a 54 y.o. [phone] who LMP was No LMP recorded. Patient is postmenopausal., presents today for her annual examination.  Her menses are absent due to menopause. She does not have intermenstrual bleeding. She does not have vasomotor sx.  Sex activity: infrequently; single partner, contraception - post menopausal status. She does have vaginal dryness and uses lubricants.   Last Pap: 07/10/17 Results were: no abnormalities /neg HPV DNA  Hx of STDs: none  Last mammogram: 06/30/19  Results were: normal--routine follow-up in 12 months There is no FH of breast cancer. There is a FH of ovarian cancer in her mom. Her sister is BRCA neg. Pt has [place] ins and doesn't meet their testing guidelines. The patient does do self-breast exams.  She had neg u/s and ca-125 4/17.  Colonoscopy: never. Willing to go for scr colonoscopy this yr.   Tobacco use: The patient denies current or previous tobacco use. Alcohol use: nightly drink No drug use Exercise: moderately active  She does get adequate calcium and Vitamin D in her diet.  Labs with PCP. Pt needs Rx RF xanax to take sparingly for occas anxiety.    Past Medical History:  Diagnosis Date  . Basal cell carcinoma   . Dysmenorrhea   . Endometrial polyp   . Fracture 2015   Left heel  . Gestational diabetes   . Hyperlipidemia   . Hypertension   . Menorrhagia   . PMDD (premenstrual dysphoric disorder)   . 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 CRYOTHERAPY  1985  . ENDOMETRIAL ABLATION W/ NOVASURE  11/2009  . ENDOMETRIAL BIOPSY  10/2009  . hysteroscopy D&C  2011  . SKIN CANCER EXCISION      Family History  Problem Relation Age of Onset  . Ovarian cancer Mother 61       2014, insurance would not cover testing. Sister is  BRCA Neg  . Hypertension Mother   . Hypertension Father     Social History   Socioeconomic History  . Marital status: Married    Spouse name: Not on file  . Number of children: Not on file  . Years of education: Not on file  . Highest education level: Not on file  Occupational History  . Not on file  Tobacco Use  . Smoking status: Never Smoker  . Smokeless tobacco: Never Used  Substance and Sexual Activity  . Alcohol use: Yes  . Drug use: No  . Sexual activity: Yes    Birth control/protection: None, Post-menopausal  Other Topics Concern  . Not on file  Social History Narrative  . Not on file   Social Determinants of Health   Financial Resource Strain:   . Difficulty of Paying Living Expenses: Not on file  Food Insecurity:   . Worried About Charity fundraiser in the Last Year: Not on file  . Ran Out of Food in the Last Year: Not on file  Transportation Needs:   . Lack of Transportation (Medical): Not on file  . Lack of Transportation (Non-Medical): Not on file  Physical Activity:   . Days of Exercise per Week: Not on file  . Minutes of Exercise per Session: Not on file  Stress:   .  Feeling of Stress : Not on file  Social Connections:   . Frequency of Communication with Friends and Family: Not on file  . Frequency of Social Gatherings with Friends and Family: Not on file  . Attends Religious Services: Not on file  . Active Member of Clubs or Organizations: Not on file  . Attends Archivist Meetings: Not on file  . Marital Status: Not on file  Intimate Partner Violence:   . Fear of Current or Ex-Partner: Not on file  . Emotionally Abused: Not on file  . Physically Abused: Not on file  . Sexually Abused: Not on file    Current Meds  Medication Sig  . ALPRAZolam (XANAX) 0.5 MG tablet Take 1 tablet (0.5 mg total) by mouth 2 (two) times daily as needed for anxiety.  Marland Kitchen BYSTOLIC 5 MG tablet Take 5 mg by mouth daily.  . chlorthalidone (HYGROTON) 25 MG  tablet chlorthalidone 25 mg tablet  . rosuvastatin (CRESTOR) 20 MG tablet Take 20 mg by mouth at bedtime.  . [DISCONTINUED] ALPRAZolam (XANAX) 0.5 MG tablet Take 1 tablet (0.5 mg total) by mouth 2 (two) times daily as needed for anxiety.      ROS:  Review of Systems  Constitutional: Negative for fatigue, fever and unexpected weight change.  Respiratory: Negative for cough, shortness of breath and wheezing.   Cardiovascular: Negative for chest pain, palpitations and leg swelling.  Gastrointestinal: Negative for blood in stool, constipation, diarrhea, nausea and vomiting.  Endocrine: Negative for cold intolerance, heat intolerance and polyuria.  Genitourinary: Negative for dyspareunia, dysuria, flank pain, frequency, genital sores, hematuria, menstrual problem, pelvic pain, urgency, vaginal bleeding, vaginal discharge and vaginal pain.  Musculoskeletal: Negative for back pain, joint swelling and myalgias.  Skin: Negative for rash.  Neurological: Negative for dizziness, syncope, light-headedness, numbness and headaches.  Hematological: Negative for adenopathy.  Psychiatric/Behavioral: Negative for agitation, confusion, sleep disturbance and suicidal ideas. The patient is not nervous/anxious.      Objective: BP 120/90   Ht 4' 11"  (1.499 m)   Wt 138 lb (62.6 kg)   BMI 27.87 kg/m    Physical Exam Constitutional:      Appearance: She is well-developed.  Genitourinary:     Vulva, vagina, uterus, right adnexa, left adnexa and rectum normal.     No vulval lesion or tenderness noted.     No vaginal discharge, erythema or tenderness.     No cervical motion tenderness or polyp.     Uterus is not enlarged or tender.     No right or left adnexal mass present.     Right adnexa not tender.     Left adnexa not tender.  Rectum:     Guaiac result negative.     No anal fissure or tenderness.  Neck:     Thyroid: No thyromegaly.  Cardiovascular:     Rate and Rhythm: Normal rate and regular  rhythm.     Heart sounds: Normal heart sounds. No murmur.  Pulmonary:     Effort: Pulmonary effort is normal.     Breath sounds: Normal breath sounds.  Chest:     Breasts:        Right: No mass, nipple discharge, skin change or tenderness.        Left: No mass, nipple discharge, skin change or tenderness.  Abdominal:     Palpations: Abdomen is soft.     Tenderness: There is no abdominal tenderness. There is no guarding.  Musculoskeletal:  General: Normal range of motion.     Cervical back: Normal range of motion.  Neurological:     General: No focal deficit present.     Mental Status: She is alert and oriented to person, place, and time.     Cranial Nerves: No cranial nerve deficit.  Skin:    General: Skin is warm and dry.  Psychiatric:        Mood and Affect: Mood normal.        Behavior: Behavior normal.        Thought Content: Thought content normal.        Judgment: Judgment normal.  Vitals reviewed.     Assessment/Plan:  Encounter for annual routine gynecological examination  Encounter for screening mammogram for malignant neoplasm of breast; pt current on mammo  Family history of ovarian cancer--doesn't meet [employer] ins. Will cont to monitor their guidelines for testing.   Screening for colon cancer - Plan: Ambulatory referral to Gastroenterology; refer to GI for scr colonoscopy due to age  Anxiety - Rx xanax RF. Pt takes sparingly. - Plan: ALPRAZolam (XANAX) 0.5 MG tablet   Meds ordered this encounter  Medications  . ALPRAZolam (XANAX) 0.5 MG tablet    Sig: Take 1 tablet (0.5 mg total) by mouth 2 (two) times daily as needed for anxiety.    Dispense:  30 tablet    Refill:  0    Order Specific Question:   Supervising Provider    Answer:   Gae Dry [[id number]]            GYN counsel breast self exam, mammography screening, menopause, adequate intake of calcium and vitamin D, diet and exercise    F/U  Return in about 1 year (around  08/16/2020).  Shaterrica Territo B. Robinson Brinkley, PA-C 08/17/2019 9:19 AM

## 2019-08-17 ENCOUNTER — Ambulatory Visit (INDEPENDENT_AMBULATORY_CARE_PROVIDER_SITE_OTHER): Payer: Federal, State, Local not specified - PPO | Admitting: Obstetrics and Gynecology

## 2019-08-17 ENCOUNTER — Telehealth: Payer: Self-pay

## 2019-08-17 ENCOUNTER — Other Ambulatory Visit: Payer: Self-pay

## 2019-08-17 ENCOUNTER — Encounter: Payer: Self-pay | Admitting: Obstetrics and Gynecology

## 2019-08-17 VITALS — BP 120/90 | Ht 59.0 in | Wt 138.0 lb

## 2019-08-17 DIAGNOSIS — Z8041 Family history of malignant neoplasm of ovary: Secondary | ICD-10-CM

## 2019-08-17 DIAGNOSIS — Z01419 Encounter for gynecological examination (general) (routine) without abnormal findings: Secondary | ICD-10-CM

## 2019-08-17 DIAGNOSIS — F419 Anxiety disorder, unspecified: Secondary | ICD-10-CM | POA: Diagnosis not present

## 2019-08-17 DIAGNOSIS — Z1231 Encounter for screening mammogram for malignant neoplasm of breast: Secondary | ICD-10-CM

## 2019-08-17 DIAGNOSIS — Z1211 Encounter for screening for malignant neoplasm of colon: Secondary | ICD-10-CM

## 2019-08-17 MED ORDER — ALPRAZOLAM 0.5 MG PO TABS
0.5000 mg | ORAL_TABLET | Freq: Two times a day (BID) | ORAL | 0 refills | Status: DC | PRN
Start: 2019-08-17 — End: 2020-09-20

## 2019-08-17 NOTE — Telephone Encounter (Signed)
Gastroenterology Pre-Procedure Review  Request Date: Tuesday 09/14/19 Requesting Physician: Dr. Marius Ditch  PATIENT REVIEW QUESTIONS: The patient responded to the following health history questions as indicated:    1. Are you having any GI issues? no 2. Do you have a personal history of Polyps? no 3. Do you have a family history of Colon Cancer or Polyps? no 4. Diabetes Mellitus? no 5. Joint replacements in the past 12 months?no 6. Major health problems in the past 3 months?no 7. Any artificial heart valves, MVP, or defibrillator?no    MEDICATIONS & ALLERGIES:    Patient reports the following regarding taking any anticoagulation/antiplatelet therapy:   Plavix, Coumadin, Eliquis, Xarelto, Lovenox, Pradaxa, Brilinta, or Effient? no Aspirin? no  Patient confirms/reports the following medications:  Current Outpatient Medications  Medication Sig Dispense Refill  . ALPRAZolam (XANAX) 0.5 MG tablet Take 1 tablet (0.5 mg total) by mouth 2 (two) times daily as needed for anxiety. 30 tablet 0  . BYSTOLIC 5 MG tablet Take 5 mg by mouth daily.    . chlorthalidone (HYGROTON) 25 MG tablet chlorthalidone 25 mg tablet    . rosuvastatin (CRESTOR) 20 MG tablet Take 20 mg by mouth at bedtime.     No current facility-administered medications for this visit.    Patient confirms/reports the following allergies:  Allergies  Allergen Reactions  . Simvastatin Anaphylaxis    No orders of the defined types were placed in this encounter.   AUTHORIZATION INFORMATION Primary Insurance: 1D#: Group #:  Secondary Insurance: 1D#: Group #:  SCHEDULE INFORMATION: Date: Friday 03/30/21Time: Location:[facility]

## 2019-08-17 NOTE — Patient Instructions (Signed)
I value your feedback and entrusting us with your care. If you get a [facility] patient survey, I would appreciate you taking the time to let us know about your experience today. Thank you!  As of May 27, 2019, your lab results will be released to your MyChart immediately, before I even have a chance to see them. Please give me time to review them and contact you if there are any abnormalities. Thank you for your patience.  

## 2019-09-07 ENCOUNTER — Other Ambulatory Visit: Payer: Self-pay

## 2019-09-07 ENCOUNTER — Encounter: Payer: Self-pay | Admitting: Gastroenterology

## 2019-09-10 ENCOUNTER — Other Ambulatory Visit: Admission: RE | Admit: 2019-09-10 | Payer: Federal, State, Local not specified - PPO | Source: Ambulatory Visit

## 2019-09-13 ENCOUNTER — Other Ambulatory Visit
Admission: RE | Admit: 2019-09-13 | Discharge: 2019-09-13 | Disposition: A | Discharge status: Home / Self Care | Payer: Federal, State, Local not specified - PPO | Source: Ambulatory Visit | Attending: Gastroenterology | Admitting: Gastroenterology

## 2019-09-13 DIAGNOSIS — Z20822 Contact with and (suspected) exposure to covid-19: Secondary | ICD-10-CM | POA: Insufficient documentation

## 2019-09-13 DIAGNOSIS — Z01812 Encounter for preprocedural laboratory examination: Secondary | ICD-10-CM | POA: Insufficient documentation

## 2019-09-13 LAB — SARS CORONAVIRUS 2 (TAT 6-24 HRS): SARS Coronavirus 2: NEGATIVE

## 2019-09-13 NOTE — Discharge Instructions (Signed)
General Anesthesia, Adult, Care After This sheet gives you information about how to care for yourself after your procedure. Your health care provider may also give you more specific instructions. If you have problems or questions, contact your health care provider. What can I expect after the procedure? After the procedure, the following side effects are common:  Pain or discomfort at the IV site.  Nausea.  Vomiting.  Sore throat.  Trouble concentrating.  Feeling cold or chills.  Weak or tired.  Sleepiness and fatigue.  Soreness and body aches. These side effects can affect parts of the body that were not involved in surgery. Follow these instructions at home:  For at least 24 hours after the procedure:  Have a responsible adult stay with you. It is important to have someone help care for you until you are awake and alert.  Rest as needed.  Do not: ? Participate in activities in which you could fall or become injured. ? Drive. ? Use heavy machinery. ? Drink alcohol. ? Take sleeping pills or medicines that cause drowsiness. ? Make important decisions or sign legal documents. ? Take care of children on your own. Eating and drinking  Follow any instructions from your health care provider about eating or drinking restrictions.  When you feel hungry, start by eating small amounts of foods that are soft and easy to digest (bland), such as toast. Gradually return to your regular diet.  Drink enough fluid to keep your urine pale yellow.  If you vomit, rehydrate by drinking water, juice, or clear broth. General instructions  If you have sleep apnea, surgery and certain medicines can increase your risk for breathing problems. Follow instructions from your health care provider about wearing your sleep device: ? Anytime you are sleeping, including during daytime naps. ? While taking prescription pain medicines, sleeping medicines, or medicines that make you drowsy.  Return to  your normal activities as told by your health care provider. Ask your health care provider what activities are safe for you.  Take over-the-counter and prescription medicines only as told by your health care provider.  If you smoke, do not smoke without supervision.  Keep all follow-up visits as told by your health care provider. This is important. Contact a health care provider if:  You have nausea or vomiting that does not get better with medicine.  You cannot eat or drink without vomiting.  You have pain that does not get better with medicine.  You are unable to pass urine.  You develop a skin rash.  You have a fever.  You have redness around your IV site that gets worse. Get help right away if:  You have difficulty breathing.  You have chest pain.  You have blood in your urine or stool, or you vomit blood. Summary  After the procedure, it is common to have a sore throat or nausea. It is also common to feel tired.  Have a responsible adult stay with you for the first 24 hours after general anesthesia. It is important to have someone help care for you until you are awake and alert.  When you feel hungry, start by eating small amounts of foods that are soft and easy to digest (bland), such as toast. Gradually return to your regular diet.  Drink enough fluid to keep your urine pale yellow.  Return to your normal activities as told by your health care provider. Ask your health care provider what activities are safe for you. This information is not   intended to replace advice given to you by your health care provider. Make sure you discuss any questions you have with your health care provider. Document Revised: 06/06/2017 Document Reviewed: 01/17/2017 Elsevier Patient Education  2020 [employer]vier Inc.  

## 2019-09-13 NOTE — Anesthesia Preprocedure Evaluation (Addendum)
Anesthesia Evaluation  Patient identified by MRN, date of birth, ID band Patient awake    Reviewed: Allergy & Precautions, NPO status , Patient's Chart, lab work & pertinent test results  History of Anesthesia Complications Negative for: history of anesthetic complications  Airway Mallampati: II  TM Distance: >3 FB Neck ROM: Full    Dental   Pulmonary   COVID+ 04/18/19   breath sounds clear to auscultation       Cardiovascular hypertension, (-) angina(-) DOE  Rhythm:Regular Rate:Normal   HLD   Neuro/Psych PSYCHIATRIC DISORDERS Anxiety Depression    GI/Hepatic neg GERD  ,  Endo/Other  diabetes, Gestational  Renal/GU      Musculoskeletal   Abdominal   Peds  Hematology   Anesthesia Other Findings Skin cancer  Reproductive/Obstetrics                            Anesthesia Physical Anesthesia Plan  ASA: II  Anesthesia Plan: General   Post-op Pain Management:    Induction: Intravenous  PONV Risk Score and Plan: 3 and Propofol infusion, TIVA and Treatment may vary due to age or medical condition  Airway Management Planned: Natural Airway and Nasal Cannula  Additional Equipment:   Intra-op Plan:   Post-operative Plan:   Informed Consent: I have reviewed the patients History and Physical, chart, labs and discussed the procedure including the risks, benefits and alternatives for the proposed anesthesia with the patient or authorized representative who has indicated his/her understanding and acceptance.       Plan Discussed with: CRNA and Anesthesiologist  Anesthesia Plan Comments:         Anesthesia Quick Evaluation

## 2019-09-14 ENCOUNTER — Other Ambulatory Visit: Payer: Self-pay

## 2019-09-14 ENCOUNTER — Ambulatory Visit: Payer: Federal, State, Local not specified - PPO | Admitting: Anesthesiology

## 2019-09-14 ENCOUNTER — Encounter: Payer: Self-pay | Admitting: Gastroenterology

## 2019-09-14 ENCOUNTER — Ambulatory Visit
Admission: RE | Admit: 2019-09-14 | Discharge: 2019-09-14 | Disposition: A | Discharge status: Home / Self Care | Payer: Federal, State, Local not specified - PPO | Attending: Gastroenterology | Admitting: Gastroenterology

## 2019-09-14 ENCOUNTER — Encounter
Admission: RE | Disposition: A | Discharge status: Home / Self Care | Payer: Self-pay | Source: Home / Self Care | Attending: Gastroenterology

## 2019-09-14 DIAGNOSIS — K621 Rectal polyp: Secondary | ICD-10-CM | POA: Diagnosis not present

## 2019-09-14 DIAGNOSIS — D120 Benign neoplasm of cecum: Secondary | ICD-10-CM | POA: Diagnosis not present

## 2019-09-14 DIAGNOSIS — I10 Essential (primary) hypertension: Secondary | ICD-10-CM | POA: Diagnosis not present

## 2019-09-14 DIAGNOSIS — E785 Hyperlipidemia, unspecified: Secondary | ICD-10-CM | POA: Diagnosis not present

## 2019-09-14 DIAGNOSIS — Z79899 Other long term (current) drug therapy: Secondary | ICD-10-CM | POA: Insufficient documentation

## 2019-09-14 DIAGNOSIS — Z8249 Family history of ischemic heart disease and other diseases of the circulatory system: Secondary | ICD-10-CM | POA: Insufficient documentation

## 2019-09-14 DIAGNOSIS — N946 Dysmenorrhea, unspecified: Secondary | ICD-10-CM | POA: Insufficient documentation

## 2019-09-14 DIAGNOSIS — Z888 Allergy status to other drugs, medicaments and biological substances status: Secondary | ICD-10-CM | POA: Insufficient documentation

## 2019-09-14 DIAGNOSIS — D125 Benign neoplasm of sigmoid colon: Secondary | ICD-10-CM | POA: Insufficient documentation

## 2019-09-14 DIAGNOSIS — Z1211 Encounter for screening for malignant neoplasm of colon: Secondary | ICD-10-CM | POA: Diagnosis present

## 2019-09-14 DIAGNOSIS — Z85828 Personal history of other malignant neoplasm of skin: Secondary | ICD-10-CM | POA: Diagnosis not present

## 2019-09-14 DIAGNOSIS — K635 Polyp of colon: Secondary | ICD-10-CM | POA: Diagnosis not present

## 2019-09-14 DIAGNOSIS — Z8041 Family history of malignant neoplasm of ovary: Secondary | ICD-10-CM | POA: Diagnosis not present

## 2019-09-14 HISTORY — DX: Presence of spectacles and contact lenses: Z97.3

## 2019-09-14 HISTORY — PX: COLONOSCOPY WITH PROPOFOL: SHX5780

## 2019-09-14 HISTORY — PX: POLYPECTOMY: SHX5525

## 2019-09-14 SURGERY — COLONOSCOPY WITH PROPOFOL
Anesthesia: General

## 2019-09-14 MED ORDER — LIDOCAINE HCL (CARDIAC) PF 100 MG/5ML IV SOSY
PREFILLED_SYRINGE | INTRAVENOUS | Status: DC | PRN
  Administered 2019-09-14: 50 mg via INTRAVENOUS

## 2019-09-14 MED ORDER — LACTATED RINGERS IV SOLN
100.0000 mL/h | INTRAVENOUS | Status: DC
  Administered 2019-09-14: 100 mL/h via INTRAVENOUS

## 2019-09-14 MED ORDER — STERILE WATER FOR IRRIGATION IR SOLN
Status: DC | PRN
  Administered 2019-09-14: 300 mL

## 2019-09-14 MED ORDER — ACETAMINOPHEN 10 MG/ML IV SOLN: 1000.0000 mg | Freq: Once | INTRAVENOUS | Status: DC | PRN

## 2019-09-14 MED ORDER — PROPOFOL 10 MG/ML IV BOLUS
INTRAVENOUS | Status: DC | PRN
  Administered 2019-09-14: 50 mg via INTRAVENOUS
  Administered 2019-09-14: 100 mg via INTRAVENOUS
  Administered 2019-09-14 (×2): 50 mg via INTRAVENOUS

## 2019-09-14 MED ORDER — ONDANSETRON HCL 4 MG/2ML IJ SOLN: 4.0000 mg | Freq: Once | INTRAMUSCULAR | Status: DC | PRN

## 2019-09-14 SURGICAL SUPPLY — 25 items
CANISTER SUCT 1200ML W/VALVE (MISCELLANEOUS) ×3 IMPLANT
CLIP HMST 235XBRD CATH ROT (MISCELLANEOUS) IMPLANT
CLIP RESOLUTION 360 11X235 (MISCELLANEOUS)
ELECT REM PT RETURN 9FT ADLT (ELECTROSURGICAL) ×3
ELECTRODE REM PT RTRN 9FT ADLT (ELECTROSURGICAL) ×2 IMPLANT
FCP ESCP3.2XJMB 240X2.8X (MISCELLANEOUS)
FORCEPS BIOP RAD 4 LRG CAP 4 (CUTTING FORCEPS) ×3 IMPLANT
FORCEPS BIOP RJ4 240 W/NDL (MISCELLANEOUS)
FORCEPS ESCP3.2XJMB 240X2.8X (MISCELLANEOUS) IMPLANT
GOWN CVR UNV OPN BCK APRN NK (MISCELLANEOUS) ×4 IMPLANT
GOWN ISOL THUMB LOOP REG UNIV (MISCELLANEOUS) ×2
INJECTOR VARIJECT VIN23 (MISCELLANEOUS) IMPLANT
KIT DEFENDO VALVE AND CONN (KITS) IMPLANT
KIT ENDO PROCEDURE OLY (KITS) ×3 IMPLANT
MARKER SPOT ENDO TATTOO 5ML (MISCELLANEOUS) IMPLANT
PROBE APC STR FIRE (PROBE) IMPLANT
RETRIEVER NET ROTH 2.5X230 LF (MISCELLANEOUS) IMPLANT
SNARE COLD EXACTO (MISCELLANEOUS) IMPLANT
SNARE SHORT THROW 13M SML OVAL (MISCELLANEOUS) ×3 IMPLANT
SNARE SHORT THROW 30M LRG OVAL (MISCELLANEOUS) IMPLANT
SNARE SNG USE RND 15MM (INSTRUMENTS) ×3 IMPLANT
SPOT EX ENDOSCOPIC TATTOO (MISCELLANEOUS)
TRAP ETRAP POLY (MISCELLANEOUS) ×3 IMPLANT
VARIJECT INJECTOR VIN23 (MISCELLANEOUS)
WATER STERILE IRR 250ML POUR (IV SOLUTION) ×3 IMPLANT

## 2019-09-14 NOTE — Op Note (Signed)
[facility] Gastroenterology Patient Name: Crystal Li Procedure Date: 09/14/2019 9:11 AM MRN: [id number] Account #: [id number] Date of Birth: [birth year] Admit Type: Outpatient Age: 54 Room: [facility] OR ROOM 01 Gender: Female Note Status: Finalized Procedure:             Colonoscopy Indications:           Screening for colorectal malignant neoplasm, This is                         the patient's first colonoscopy Providers:             Lin Landsman MD, MD Medicines:             Monitored Anesthesia Care Complications:         No immediate complications. Estimated blood loss: None. Procedure:             Pre-Anesthesia Assessment:                        - Prior to the procedure, a History and Physical was                         performed, and patient medications and allergies were                         reviewed. The patient is competent. The risks and                         benefits of the procedure and the sedation options and                         risks were discussed with the patient. All questions                         were answered and informed consent was obtained.                         Patient identification and proposed procedure were                         verified by the physician, the nurse, the                         anesthesiologist, the anesthetist and the technician                         in the pre-procedure area in the procedure room in the                         endoscopy suite. Mental Status Examination: alert and                         oriented. Airway Examination: normal oropharyngeal                         airway and neck mobility. Respiratory Examination:                         clear to auscultation. CV Examination: normal.  Prophylactic Antibiotics: The patient does not require                         prophylactic antibiotics. Prior Anticoagulants: The                         patient has taken no  previous anticoagulant or                         antiplatelet agents. ASA Grade Assessment: II - A                         patient with mild systemic disease. After reviewing                         the risks and benefits, the patient was deemed in                         satisfactory condition to undergo the procedure. The                         anesthesia plan was to use monitored anesthesia care                         (MAC). Immediately prior to administration of                         medications, the patient was re-assessed for adequacy                         to receive sedatives. The heart rate, respiratory                         rate, oxygen saturations, blood pressure, adequacy of                         pulmonary ventilation, and response to care were                         monitored throughout the procedure. The physical                         status of the patient was re-assessed after the                         procedure.                        After obtaining informed consent, the colonoscope was                         passed under direct vision. Throughout the procedure,                         the patient's blood pressure, pulse, and oxygen                         saturations were monitored continuously. The was  introduced through the anus and advanced to the the                         cecum, identified by appendiceal orifice and ileocecal                         valve. The colonoscopy was performed without                         difficulty. The patient tolerated the procedure well.                         The quality of the bowel preparation was evaluated                         using the BBPS [facility] Bowel Preparation Scale) with                         scores of: Right Colon = 3, Transverse Colon = 3 and                         Left Colon = 3 (entire mucosa seen well with no                         residual staining, small fragments of  stool or opaque                         liquid). The total BBPS score equals 9. Findings:      The perianal and digital rectal examinations were normal. Pertinent       negatives include normal sphincter tone and no palpable rectal lesions.      A 12 mm polyp was found in the recto-sigmoid colon. The polyp was       pedunculated. The polyp was removed with a hot snare. Resection and       retrieval were complete.      A 3 mm polyp was found in the recto-sigmoid colon. The polyp was       sessile. The polyp was removed with a cold snare. Resection and       retrieval were complete.      The retroflexed view of the distal rectum and anal verge was normal and       showed no anal or rectal abnormalities.      The exam was otherwise without abnormality.      A 2 mm polyp was found in the cecum. The polyp was sessile. The polyp       was removed with a cold biopsy forceps. Resection and retrieval were       complete. Impression:            - One 12 mm polyp at the recto-sigmoid colon, removed                         with a hot snare. Resected and retrieved.                        - One 3 mm polyp at the recto-sigmoid colon, removed  with a cold snare. Resected and retrieved.                        - The distal rectum and anal verge are normal on                         retroflexion view.                        - The examination was otherwise normal. Recommendation:        - Discharge patient to home (with escort).                        - Resume previous diet today.                        - Continue present medications.                        - Await pathology results.                        - Repeat colonoscopy in 3 years for surveillance. Procedure Code(s):     --- Professional ---                        [phone], Colonoscopy, flexible; with removal of                         tumor(s), polyp(s), or other lesion(s) by snare                         technique                         45380, 89, Colonoscopy, flexible; with biopsy, single                         or multiple Diagnosis Code(s):     --- Professional ---                        Z12.11, Encounter for screening for malignant neoplasm                         of colon                        K63.5, Polyp of colon CPT copyright 2019 American Medical Association. All rights reserved. The codes documented in this report are preliminary and upon coder review may  be revised to meet current compliance requirements. Dr. Ulyess Mort Lin Landsman MD, MD 09/14/2019 9:39:41 AM This report has been signed electronically. Number of Addenda: 0 Note Initiated On: 09/14/2019 9:11 AM Scope Withdrawal Time: 0 hours 11 minutes 8 seconds  Total Procedure Duration: 0 hours 15 minutes 44 seconds  Estimated Blood Loss:  Estimated blood loss: none.      [facility]

## 2019-09-14 NOTE — Anesthesia Postprocedure Evaluation (Signed)
Anesthesia Post Note  Patient: Crystal Li  Procedure(s) Performed: COLONOSCOPY WITH PROPOFOL (N/A ) POLYPECTOMY     Patient location during evaluation: PACU Anesthesia Type: General Level of consciousness: awake and alert Pain management: pain level controlled Vital Signs Assessment: post-procedure vital signs reviewed and stable Respiratory status: spontaneous breathing, nonlabored ventilation, respiratory function stable and patient connected to nasal cannula oxygen Cardiovascular status: blood pressure returned to baseline and stable Postop Assessment: no apparent nausea or vomiting Anesthetic complications: no    [name]  [name]

## 2019-09-14 NOTE — H&P (Signed)
Crystal Darby, MD [address]  [place]  [place], [place] [address]  Main: [phone]  Fax: [phone] Pager: [phone]  Primary Care Physician:  [facility] Primary Gastroenterologist:  Dr. Cephas Li  Pre-Procedure History & Physical: HPI:  Crystal Li is a 54 y.o. female is here for an colonoscopy.   Past Medical History:  Diagnosis Date  . Basal cell carcinoma   . Dysmenorrhea   . Endometrial polyp   . Fracture 2015   Left heel  . Gestational diabetes   . Hyperlipidemia   . Hypertension   . Menorrhagia   . PMDD (premenstrual dysphoric disorder)   . 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 Wears contact lenses     Past Surgical History:  Procedure Laterality Date  . CRYOTHERAPY  1985  . ENDOMETRIAL ABLATION W/ NOVASURE  11/2009  . ENDOMETRIAL BIOPSY  10/2009  . hysteroscopy D&C  2011  . SKIN CANCER EXCISION      Prior to Admission medications   Medication Sig Start Date End Date Taking? Authorizing Provider  ALPRAZolam Duanne Moron) 0.5 MG tablet Take 1 tablet (0.5 mg total) by mouth 2 (two) times daily as needed for anxiety. 08/22/16  Yes Copland, Alicia B, PA-C  BYSTOLIC 5 MG tablet Take 10 mg by mouth daily.  07/30/19  Yes [provider]  chlorthalidone (HYGROTON) 25 MG tablet chlorthalidone 25 mg tablet   Yes [provider]  Cholecalciferol (VITAMIN D3) 50 MCG (2000 UT) TABS Take by mouth daily.   Yes [provider]  rosuvastatin (CRESTOR) 20 MG tablet Take 20 mg by mouth at bedtime. 08/05/19  Yes [provider]    Allergies as of 08/17/2019 - Review Complete 08/17/2019  Allergen Reaction Noted  . Simvastatin Anaphylaxis 07/10/2017    Family History  Problem Relation Age of Onset  . Ovarian cancer Mother 61       2014, insurance would not cover testing. Sister is BRCA Neg  . Hypertension Mother   . Hypertension Father     Social History   Socioeconomic  History  . Marital status: Married    Spouse name: Not on file  . Number of children: Not on file  . Years of education: Not on file  . Highest education level: Not on file  Occupational History  . Not on file  Tobacco Use  . Smoking status: Never Smoker  . Smokeless tobacco: Never Used  Substance and Sexual Activity  . Alcohol use: Yes    Alcohol/week: 7.0 standard drinks    Types: 7 Glasses of wine per week  . Drug use: No  . Sexual activity: Yes    Birth control/protection: None, Post-menopausal  Other Topics Concern  . Not on file  Social History Narrative  . Not on file   Social Determinants of Health   Financial Resource Strain:   . Difficulty of Paying Living Expenses:   Food Insecurity:   . Worried About Charity fundraiser in the Last Year:   . Arboriculturist in the Last Year:   Transportation Needs:   . Film/video editor (Medical):   Marland Kitchen Lack of Transportation (Non-Medical):   Physical Activity:   . Days of Exercise per Week:   . Minutes of Exercise per Session:   Stress:   . Feeling of Stress :   Social Connections:   . Frequency of Communication with Friends and Family:   .  Frequency of Social Gatherings with Friends and Family:   . Attends Religious Services:   . Active Member of Clubs or Organizations:   . Attends Archivist Meetings:   Marland Kitchen Marital Status:   Intimate Partner Violence:   . Fear of Current or Ex-Partner:   . Emotionally Abused:   Marland Kitchen Physically Abused:   . Sexually Abused:     Review of Systems: See HPI, otherwise negative ROS  Physical Exam: BP (!) 146/89   Pulse 71   Temp 97.8 F (36.6 C) (Temporal)   Resp 18   Ht 5' (1.524 m)   Wt 62.1 kg   SpO2 98%   BMI 26.76 kg/m  General:   Alert,  pleasant and cooperative in NAD Head:  Normocephalic and atraumatic. Neck:  Supple; no masses or thyromegaly. Lungs:  Clear throughout to auscultation.    Heart:  Regular rate and rhythm. Abdomen:  Soft, nontender and  nondistended. Normal bowel sounds, without guarding, and without rebound.   Neurologic:  Alert and  oriented x4;  grossly normal neurologically.  Impression/Plan: Crystal Li is here for an colonoscopy to be performed for colon cancer screening  Risks, benefits, limitations, and alternatives regarding  colonoscopy have been reviewed with the patient.  Questions have been answered.  All parties agreeable.   Sherri Sear, MD  09/14/2019, 9:08 AM

## 2019-09-14 NOTE — Anesthesia Procedure Notes (Signed)
Procedure Name: General with mask airway Date/Time: 09/14/2019 9:22 AM Performed by: Jeannene Patella, CRNA Pre-anesthesia Checklist: Timeout performed, Patient being monitored, Suction available, Emergency Drugs available and Patient identified Patient Re-evaluated:Patient Re-evaluated prior to induction Oxygen Delivery Method: Nasal cannula

## 2019-09-14 NOTE — Transfer of Care (Signed)
Immediate Anesthesia Transfer of Care Note  Patient: Crystal Li  Procedure(s) Performed: COLONOSCOPY WITH PROPOFOL (N/A ) POLYPECTOMY  Patient Location: PACU  Anesthesia Type: General  Level of Consciousness: awake, alert  and patient cooperative  Airway and Oxygen Therapy: Patient Spontanous Breathing and Patient connected to supplemental oxygen  Post-op Assessment: Post-op Vital signs reviewed, Patient's Cardiovascular Status Stable, Respiratory Function Stable, Patent Airway and No signs of Nausea or vomiting  Post-op Vital Signs: Reviewed and stable  Complications: No apparent anesthesia complications

## 2019-09-15 ENCOUNTER — Encounter: Payer: Self-pay | Admitting: *Deleted

## 2019-09-15 ENCOUNTER — Encounter: Payer: Self-pay | Admitting: Gastroenterology

## 2019-09-15 LAB — SURGICAL PATHOLOGY

## 2019-09-20 ENCOUNTER — Telehealth: Payer: Self-pay

## 2019-09-20 NOTE — Telephone Encounter (Signed)
Patient verbalized understanding of lab results  

## 2019-09-20 NOTE — Telephone Encounter (Signed)
-----   Message from Lin Landsman, MD sent at 09/15/2019 10:56 AM EDT ----- Please inform patient that the polyp was benign. Recommend colonoscopy in 3 years  RV

## 2020-04-12 ENCOUNTER — Encounter: Admit: 2020-04-12 | Discharge: 2020-04-12 | Payer: BLUE CROSS/BLUE SHIELD

## 2020-04-12 NOTE — Progress Notes (Signed)
This encounter was created in error - please disregard.

## 2020-04-12 NOTE — Progress Notes (Deleted)
Tamara Stout is a 54 y.o. female who presents with Headache (x3 days )       ENCOUNTER DIAGNOSIS     There are no diagnoses linked to this encounter.     This is a new problem to me.  Current medications, allergies and problem list personally reviewed in Epic today.    ASSESSMENT & PLAN     ***    PPE utilized during patient interactions:  {appppeutilized:26348}    FOLLOW UP  Data Unavailable  Data Unavailable  Data Unavailable       DIAGNOSTIC STUDIES     Imaging: No results found.   Labs: No results found for this or any previous visit (from the past 72 hour(s)).     HISTORY OF PRESENT ILLNESS     Tamara Stout is a 54 y.o. female that presents to the Urgent Care with the complaint of headache, neck pain and back pain x3 days. Needs to go back to work tomorrow. Claims it is a virus, has no respiratory sx.     Neck Pain   This is a new problem. The current episode started in the past 7 days. The problem occurs constantly. The problem has been gradually worsening. The pain is associated with nothing. The pain is present in the left side, right side, midline and anterior neck. The quality of the pain is described as aching. The pain is mild. Nothing aggravates the symptoms. Associated symptoms include headaches and photophobia. Pertinent negatives include no fever, leg pain, numbness, pain with swallowing, tingling or trouble swallowing. She has tried NSAIDs for the symptoms. The treatment provided no relief.       MEDICAL / FAMILY / SOCIAL HISTORY     Past Medical History:   Diagnosis Date   . Anxiety     Since childhood    . Phlebitis 2017     Past Surgical History:   Procedure Laterality Date   . FRACTURE SURGERY  1982     Family History   Problem Relation Age of Onset   . Early death Mother    . Heart disease Mother    . Cancer Father         non hodgkins lymphoma, large and small cell carcinoma   . Early death Father    . Mental retardation Maternal Uncle    . Cancer Maternal Grandmother         lung    . Alcohol abuse Maternal Grandfather    . Diabetes Paternal Grandmother       No Known Allergies  Social History     Socioeconomic History   . Marital status: Widowed     Spouse name: Not on file   . Number of children: Not on file   . Years of education: 4   . Highest education level: Not on file   Tobacco Use   . Smoking status: Former Smoker     Quit date: 2010     Years since quitting: 11.8   . Smokeless tobacco: Never Used   Substance and Sexual Activity   . Alcohol use: Yes     Alcohol/week: 7.0 standard drinks     Types: 7 Glasses of wine per week   . Drug use: No   . Sexual activity: Never   Other Topics Concern   . Caffeine Concern Yes   . Exercise Not Asked     No outpatient medications have been marked as taking for the 04/12/20 encounter (  Urgent Care Visit) with [place] Padovich, FNP.       REVIEW OF SYSTEMS     Review of Systems   Constitutional: Negative for fever.   HENT: Negative for sinus pressure, sore throat and trouble swallowing.    Eyes: Positive for photophobia.   Musculoskeletal: Positive for back pain and neck pain.   Neurological: Positive for headaches. Negative for tingling and numbness.        PHYSICAL EXAM     There were no vitals filed for this visit.  There is no height or weight on file to calculate BMI.   Not recorded        Physical Exam    Do you want a care call:{UC Do you want a care call:26712}  _____________________________________________________________________    This note was transcribed using speech recognition software. Please contact us for clarification if any questions arise relating to the wording of this document.    I personally performed the services described in this documentation, as assisted by ***.

## 2020-07-31 ENCOUNTER — Telehealth: Payer: Self-pay

## 2020-07-31 DIAGNOSIS — Z1231 Encounter for screening mammogram for malignant neoplasm of breast: Secondary | ICD-10-CM

## 2020-07-31 NOTE — Telephone Encounter (Signed)
Mammo order placed. Pls notify pt to sched mammo.

## 2020-07-31 NOTE — Telephone Encounter (Signed)
Pt aware.

## 2020-07-31 NOTE — Telephone Encounter (Signed)
Patient has apt for AE 08/25/20 w/[facility]. Requesting orders for mammogram so she can go ahead and schedule her apt. [phone]

## 2020-08-23 ENCOUNTER — Ambulatory Visit
Admission: RE | Admit: 2020-08-23 | Discharge: 2020-08-23 | Disposition: A | Discharge status: Home / Self Care | Payer: Federal, State, Local not specified - PPO | Source: Ambulatory Visit | Attending: Obstetrics and Gynecology | Admitting: Obstetrics and Gynecology

## 2020-08-23 ENCOUNTER — Other Ambulatory Visit: Payer: Self-pay

## 2020-08-23 DIAGNOSIS — Z1231 Encounter for screening mammogram for malignant neoplasm of breast: Secondary | ICD-10-CM | POA: Insufficient documentation

## 2020-08-24 ENCOUNTER — Encounter: Payer: Self-pay | Admitting: Obstetrics and Gynecology

## 2020-08-25 ENCOUNTER — Ambulatory Visit: Payer: Federal, State, Local not specified - PPO | Admitting: Obstetrics and Gynecology

## 2020-09-15 DIAGNOSIS — Z1371 Encounter for nonprocreative screening for genetic disease carrier status: Secondary | ICD-10-CM

## 2020-09-15 DIAGNOSIS — Z8041 Family history of malignant neoplasm of ovary: Secondary | ICD-10-CM

## 2020-09-15 HISTORY — DX: Encounter for nonprocreative screening for genetic disease carrier status: Z13.71

## 2020-09-15 HISTORY — DX: Family history of malignant neoplasm of ovary: Z80.41

## 2020-09-19 NOTE — Progress Notes (Signed)
PCP: [facility]   Chief Complaint  Patient presents with  . Gynecologic Exam    No concerns    HPI:      [name] is a 55 y.o. [phone] who LMP was No LMP recorded. Patient is postmenopausal., presents today for her annual examination.  Her menses are absent due to menopause. She does not have PMB. She does not have vasomotor sx.  Sex activity: infrequently; single partner, contraception - post menopausal status. She does have vaginal dryness and uses lubricants.   Last Pap: 07/10/17 Results were: no abnormalities /neg HPV DNA  Hx of STDs: none  Last mammogram: 08/23/20  Results were: normal--routine follow-up in 12 months There is no FH of breast cancer. There is a FH of ovarian cancer in her mom. Her sister is BRCA neg. Pt has Day ins and hasn't met their testing guidelines but does qualify now; pt interested in testing. The patient does do self-breast exams.  She had neg u/s and ca-125 4/17.  Colonoscopy: 08/2019 with Dr. Marius Ditch with polyp; repeat due after 3 yrs.    Tobacco use: The patient denies current or previous tobacco use. Alcohol use: nightly drink No drug use Exercise: moderately active  She does get adequate calcium and Vitamin D in her diet.  Labs with PCP. Pt needs Rx RF xanax to take sparingly for occas anxiety.    Past Medical History:  Diagnosis Date  . Basal cell carcinoma   . Dysmenorrhea   . Endometrial polyp   . Fracture 2015   Left heel  . Gestational diabetes   . Hyperlipidemia   . Hypertension   . Menorrhagia   . PMDD (premenstrual dysphoric disorder)   . 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 Wears contact lenses     Past Surgical History:  Procedure Laterality Date  . COLONOSCOPY WITH PROPOFOL N/A 09/14/2019   Procedure: COLONOSCOPY WITH PROPOFOL;  Surgeon: Lin Landsman, MD;  Location: [facility];  Service: Endoscopy;  Laterality: N/A;  Priority 4  . CRYOTHERAPY   1985  . ENDOMETRIAL ABLATION W/ NOVASURE  11/2009  . ENDOMETRIAL BIOPSY  10/2009  . hysteroscopy D&C  2011  . POLYPECTOMY  09/14/2019   Procedure: POLYPECTOMY;  Surgeon: Lin Landsman, MD;  Location: [facility];  Service: Endoscopy;;  . SKIN CANCER EXCISION      Family History  Problem Relation Age of Onset  . Ovarian cancer Mother 61       2014, insurance would not cover testing. Sister is BRCA Neg  . Hypertension Mother   . Hypertension Father     Social History   Socioeconomic History  . Marital status: Married    Spouse name: Not on file  . Number of children: Not on file  . Years of education: Not on file  . Highest education level: Not on file  Occupational History  . Not on file  Tobacco Use  . Smoking status: Never Smoker  . Smokeless tobacco: Never Used  Vaping Use  . Vaping Use: Never used  Substance and Sexual Activity  . Alcohol use: Yes    Alcohol/week: 7.0 standard drinks    Types: 7 Glasses of wine per week  . Drug use: No  . Sexual activity: Yes    Birth control/protection: None, Post-menopausal  Other Topics Concern  . Not on file  Social History Narrative  . Not on file  Social Determinants of Health   Financial Resource Strain: Not on file  Food Insecurity: Not on file  Transportation Needs: Not on file  Physical Activity: Not on file  Stress: Not on file  Social Connections: Not on file  Intimate Partner Violence: Not on file    Current Meds  Medication Sig  . chlorthalidone (HYGROTON) 25 MG tablet chlorthalidone 25 mg tablet  . Cholecalciferol (VITAMIN D3) 50 MCG (2000 UT) TABS Take by mouth daily.  . nebivolol (BYSTOLIC) 10 MG tablet TAKE 1 TABLET BY MOUTH EVERY DAY IN THE MORNING  . rosuvastatin (CRESTOR) 40 MG tablet Take 40 mg by mouth at bedtime.  . [DISCONTINUED] ALPRAZolam (XANAX) 0.5 MG tablet Take 1 tablet (0.5 mg total) by mouth 2 (two) times daily as needed for anxiety.      ROS:  Review of Systems   Constitutional: Negative for fatigue, fever and unexpected weight change.  Respiratory: Negative for cough, shortness of breath and wheezing.   Cardiovascular: Negative for chest pain, palpitations and leg swelling.  Gastrointestinal: Negative for blood in stool, constipation, diarrhea, nausea and vomiting.  Endocrine: Negative for cold intolerance, heat intolerance and polyuria.  Genitourinary: Negative for dyspareunia, dysuria, flank pain, frequency, genital sores, hematuria, menstrual problem, pelvic pain, urgency, vaginal bleeding, vaginal discharge and vaginal pain.  Musculoskeletal: Negative for back pain, joint swelling and myalgias.  Skin: Negative for rash.  Neurological: Negative for dizziness, syncope, light-headedness, numbness and headaches.  Hematological: Negative for adenopathy.  Psychiatric/Behavioral: Negative for agitation, confusion, sleep disturbance and suicidal ideas. The patient is not nervous/anxious.      Objective: BP 120/80   Ht 5' (1.524 m)   Wt 125 lb (56.7 kg)   BMI 24.41 kg/m    Physical Exam Constitutional:      Appearance: She is well-developed.  Genitourinary:     Vulva and rectum normal.     Right Labia: No rash, tenderness or lesions.    Left Labia: No tenderness, lesions or rash.    No vaginal discharge, erythema or tenderness.      Right Adnexa: not tender and no mass present.    Left Adnexa: not tender and no mass present.    No cervical motion tenderness, friability or polyp.     Uterus is not enlarged or tender.  Rectum:     Guaiac result negative.     No anal fissure or tenderness.  Breasts:     Right: No mass, nipple discharge, skin change or tenderness.     Left: No mass, nipple discharge, skin change or tenderness.    Neck:     Thyroid: No thyromegaly.  Cardiovascular:     Rate and Rhythm: Normal rate and regular rhythm.     Heart sounds: Normal heart sounds. No murmur heard.   Pulmonary:     Effort: Pulmonary effort  is normal.     Breath sounds: Normal breath sounds.  Abdominal:     Palpations: Abdomen is soft.     Tenderness: There is no abdominal tenderness. There is no guarding or rebound.  Musculoskeletal:        General: Normal range of motion.     Cervical back: Normal range of motion.  Lymphadenopathy:     Cervical: No cervical adenopathy.  Neurological:     General: No focal deficit present.     Mental Status: She is alert and oriented to person, place, and time.     Cranial Nerves: No cranial nerve deficit.  Skin:  General: Skin is warm and dry.  Psychiatric:        Mood and Affect: Mood normal.        Behavior: Behavior normal.        Thought Content: Thought content normal.        Judgment: Judgment normal.  Vitals reviewed.     Assessment/Plan: Encounter for annual routine gynecological examination  Encounter for screening mammogram for malignant neoplasm of breast; pt current on mammo  Family history of ovarian cancer - Plan: Integrated [facility]AC[facility] ([place]); MyRisk testing discussed and done today. Will f/u with results.   Anxiety - Rx xanax RF. Pt takes sparingly. - Plan: ALPRAZolam (XANAX) 0.5 MG tablet   Meds ordered this encounter  Medications  . ALPRAZolam (XANAX) 0.5 MG tablet    Sig: Take 1 tablet (0.5 mg total) by mouth 2 (two) times daily as needed for anxiety.    Dispense:  30 tablet    Refill:  0    Order Specific Question:   Supervising Provider    Answer:   Gae Dry [[id number]]            GYN counsel breast self exam, mammography screening, menopause, adequate intake of calcium and vitamin D, diet and exercise    F/U  Return in about 1 year (around 09/20/2021).  Dalbert Stillings B. Aarsh Fristoe, PA-C 09/20/2020 10:36 AM

## 2020-09-20 ENCOUNTER — Ambulatory Visit (INDEPENDENT_AMBULATORY_CARE_PROVIDER_SITE_OTHER): Payer: Federal, State, Local not specified - PPO | Admitting: Obstetrics and Gynecology

## 2020-09-20 ENCOUNTER — Other Ambulatory Visit: Payer: Self-pay

## 2020-09-20 ENCOUNTER — Encounter: Payer: Self-pay | Admitting: Obstetrics and Gynecology

## 2020-09-20 VITALS — BP 120/80 | Ht 60.0 in | Wt 125.0 lb

## 2020-09-20 DIAGNOSIS — Z8041 Family history of malignant neoplasm of ovary: Secondary | ICD-10-CM | POA: Diagnosis not present

## 2020-09-20 DIAGNOSIS — F419 Anxiety disorder, unspecified: Secondary | ICD-10-CM

## 2020-09-20 DIAGNOSIS — Z1231 Encounter for screening mammogram for malignant neoplasm of breast: Secondary | ICD-10-CM

## 2020-09-20 DIAGNOSIS — Z01419 Encounter for gynecological examination (general) (routine) without abnormal findings: Secondary | ICD-10-CM | POA: Diagnosis not present

## 2020-09-20 MED ORDER — ALPRAZOLAM 0.5 MG PO TABS: 0.5000 mg | ORAL_TABLET | Freq: Two times a day (BID) | ORAL | 0 refills | Status: DC | PRN

## 2020-09-20 NOTE — Patient Instructions (Signed)
I value your feedback and you entrusting us with your care. If you get a [facility] patient survey, I would appreciate you taking the time to let us know about your experience today. Thank you! ? ? ?

## 2020-10-03 ENCOUNTER — Encounter: Payer: Self-pay | Admitting: Obstetrics and Gynecology

## 2020-10-13 ENCOUNTER — Telehealth: Payer: Self-pay | Admitting: Obstetrics and Gynecology

## 2020-10-13 NOTE — Telephone Encounter (Signed)
Pt aware of neg MyRisk results. IBIS=6.9%/riskscore=4.7%. No increased screening recommendations.   Patient understands these results only apply to her and her children, and this is not indicative of genetic testing results of her other family members. It is recommended that her other family members have genetic testing done.  Pt also understands negative genetic testing doesn't mean she will never get any of these cancers.   Hard copy mailed to pt. F/u prn.

## 2021-01-02 LAB — SARS-COV-2 (COVID-19), DIAGNOSTIC: SARS-CoV-2 (COVID-19) by RT-PCR: NOT DETECTED

## 2021-08-02 ENCOUNTER — Encounter: Payer: Self-pay | Admitting: Obstetrics and Gynecology

## 2021-08-21 ENCOUNTER — Telehealth: Payer: Self-pay | Admitting: Obstetrics and Gynecology

## 2021-08-21 NOTE — Telephone Encounter (Signed)
LM for pt that previous ATM VUS from [facility] testing 4/22 is now of "no clinical significance". Nothing further to do at this time. Hard copy mailed to pt.  ?

## 2021-09-06 ENCOUNTER — Inpatient Hospital Stay: Admit: 2021-09-06 | Payer: BLUE CROSS/BLUE SHIELD

## 2021-09-06 DIAGNOSIS — Z1231 Encounter for screening mammogram for malignant neoplasm of breast: Secondary | ICD-10-CM

## 2022-01-08 ENCOUNTER — Telehealth: Payer: Self-pay

## 2022-01-08 DIAGNOSIS — Z1231 Encounter for screening mammogram for malignant neoplasm of breast: Secondary | ICD-10-CM

## 2022-01-08 NOTE — Telephone Encounter (Signed)
Patient contacted office to advise [facility] that she has scheduled PE on 02/21/22 and would like mammogram ordered prior to appt. Last mammogram date 08/23/20. Will place in order today and notify patient. KW

## 2022-01-11 ENCOUNTER — Ambulatory Visit
Admission: RE | Admit: 2022-01-11 | Discharge: 2022-01-11 | Disposition: A | Discharge status: Home / Self Care | Payer: Federal, State, Local not specified - PPO | Source: Ambulatory Visit | Attending: Obstetrics and Gynecology | Admitting: Obstetrics and Gynecology

## 2022-01-11 DIAGNOSIS — Z1231 Encounter for screening mammogram for malignant neoplasm of breast: Secondary | ICD-10-CM | POA: Diagnosis present

## 2022-01-14 ENCOUNTER — Encounter: Payer: Self-pay | Admitting: Obstetrics and Gynecology

## 2022-01-16 ENCOUNTER — Inpatient Hospital Stay: Admit: 2022-01-16 | Discharge: 2022-01-16 | Discharge status: Against Medical Advice

## 2022-01-16 DIAGNOSIS — Z5321 Procedure and treatment not carried out due to patient leaving prior to being seen by health care provider: Secondary | ICD-10-CM

## 2022-01-16 NOTE — ED Triage Notes (Signed)
Patient reports she had some rectal bleeding a week ago that last night came back real bad and she was up all night with bleeding from her rectum.   Denies dizziness or weakness

## 2022-02-20 NOTE — Progress Notes (Signed)
PCP: [facility]   Chief Complaint  Patient presents with   Annual Exam    HPI:      [name] is a 56 y.o. [phone] who LMP was No LMP recorded. Patient is postmenopausal., presents today for her annual examination.  Her menses are absent due to menopause. She does not have PMB. She does not have vasomotor sx.  Sex activity: infrequently; single partner, contraception - post menopausal status. She does have vaginal dryness and uses lubricants.   Last Pap: 07/10/17 Results were: no abnormalities /neg HPV DNA  Hx of STDs: none  Last mammogram: 01/11/22 Results were: normal--routine follow-up in 12 months There is no FH of breast cancer. There is a FH of ovarian cancer in her mom. Her sister is BRCA neg. Pt is MyRisk neg 4/22;IBIS=6.9%/riskscore=4.7%. The patient does do self-breast exams.    Colonoscopy: 08/2019 with Dr. Marius Li with polyp; repeat due after 3 yrs.    Tobacco use: The patient denies current or previous tobacco use. Alcohol use: nightly drink No drug use Exercise: moderately active  She does get adequate calcium and Vitamin D in her diet.  Labs with PCP. Has done diet changes for wt loss; cholesterol improving. Doing well.  Pt needs Rx RF xanax to take sparingly for occas anxiety.    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   Wears contact lenses     Past Surgical History:  Procedure Laterality Date   COLONOSCOPY WITH PROPOFOL N/A 09/14/2019   Procedure: COLONOSCOPY WITH PROPOFOL;  Surgeon: Crystal Landsman, MD;  Location: [facility];  Service: Endoscopy;  Laterality: N/A;  Priority [place]  11/2009   ENDOMETRIAL BIOPSY  10/2009   hysteroscopy D&C  2011   POLYPECTOMY  09/14/2019   Procedure: POLYPECTOMY;  Surgeon: [facility] Landsman, MD;  Location: [facility];  Service: Endoscopy;;   SKIN CANCER EXCISION      Family History  Problem Relation Age of Onset   Ovarian cancer Mother 61       2014, insurance would not cover testing. Sister is BRCA Neg   Hypertension Mother    Hypertension Father     Social History   Socioeconomic History   Marital status: Married    Spouse name: Not on file   Number of children: Not on file   Years of education: Not on file   Highest education level: Not on file  Occupational History   Not on file  Tobacco Use   Smoking status: Never   Smokeless tobacco: Never  Vaping Use   Vaping Use: Never used  Substance and Sexual Activity   Alcohol use: Yes    Alcohol/week: 7.0 standard drinks of alcohol    Types: 7 Glasses of wine per week   Drug use: No   Sexual activity: Yes    Birth control/protection: None, Post-menopausal  Other  Topics Concern   Not on file  Social History Narrative   Not on file   Social Determinants of Health   Financial Resource Strain: Not on file  Food Insecurity: Not on file  Transportation Needs: Not on file  Physical Activity: Insufficiently Active (07/10/2017)   Exercise Vital Sign    Days of Exercise per Week: 2 days    Minutes of Exercise per Session: 20 min  Stress: No Stress Concern Present (07/10/2017)   [facility]    Feeling of Stress : Not at all  Social Connections: Moderately Integrated (07/10/2017)   Social Connection and Isolation Panel [NHANES]    Frequency of Communication with Friends and Family: More than three times a week    Frequency of Social Gatherings  with Friends and Family: Twice a week    Attends Religious Services: 1 to 4 times per year    Active Member of [employer] or Organizations: No    Attends Archivist Meetings: Never    Marital Status: Married  Human resources officer Violence: Not At Risk (07/10/2017)   Humiliation, Afraid, Rape, and Kick questionnaire    Fear of Current or Ex-Partner: No    Emotionally Abused: No    Physically Abused: No    Sexually Abused: No    Current Meds  Medication Sig   carvedilol ([facility] CR) 20 MG 24 hr capsule Take 20 mg by mouth daily.   chlorthalidone (HYGROTON) 25 MG tablet chlorthalidone 25 mg tablet   Cholecalciferol (VITAMIN D3) 50 MCG (2000 UT) TABS Take by mouth daily.   NEXLIZET 180-10 MG TABS Take 1 tablet by mouth every morning.   [DISCONTINUED] ALPRAZolam (XANAX) 0.5 MG tablet Take 1 tablet (0.5 mg total) by mouth 2 (two) times daily as needed for anxiety.      ROS:  Review of Systems  Constitutional:  Negative for fatigue, fever and unexpected weight change.  Respiratory:  Negative for cough, shortness of breath and wheezing.   Cardiovascular:  Negative for chest pain, palpitations and leg swelling.  Gastrointestinal:  Negative for blood in stool, constipation, diarrhea, nausea and vomiting.  Endocrine: Negative for cold intolerance, heat intolerance and polyuria.  Genitourinary:  Negative for dyspareunia, dysuria, flank pain, frequency, genital sores, hematuria, menstrual problem, pelvic pain, urgency, vaginal bleeding, vaginal discharge and vaginal pain.  Musculoskeletal:  Negative for back pain, joint swelling and myalgias.  Skin:  Negative for rash.  Neurological:  Negative for dizziness, syncope, light-headedness, numbness and headaches.  Hematological:  Negative for adenopathy.  Psychiatric/Behavioral:  Positive for agitation. Negative for confusion, sleep disturbance and suicidal ideas. The patient is not nervous/anxious.      Objective: BP (!) 150/90   Ht 5' (1.524  m)   Wt 112 lb (50.8 kg)   BMI 21.87 kg/m    Physical Exam Constitutional:      Appearance: She is well-developed.  Genitourinary:     Vulva and rectum normal.     Right Labia: No rash, tenderness or lesions.    Left Labia: No tenderness, lesions or rash.    No vaginal discharge, erythema or tenderness.      Right Adnexa: not tender and no mass present.    Left Adnexa: not tender and no mass present.    No cervical motion tenderness, friability or polyp.     Uterus is not enlarged or tender.  Rectum:     Guaiac result negative.     No anal fissure  or tenderness.  Breasts:    Right: No mass, nipple discharge, skin change or tenderness.     Left: No mass, nipple discharge, skin change or tenderness.  Neck:     Thyroid: No thyromegaly.  Cardiovascular:     Rate and Rhythm: Normal rate and regular rhythm.     Heart sounds: Normal heart sounds. No murmur heard. Pulmonary:     Effort: Pulmonary effort is normal.     Breath sounds: Normal breath sounds.  Abdominal:     Palpations: Abdomen is soft.     Tenderness: There is no abdominal tenderness. There is no guarding or rebound.  Musculoskeletal:        General: Normal range of motion.     Cervical back: Normal range of motion.  Lymphadenopathy:     Cervical: No cervical adenopathy.  Neurological:     General: No focal deficit present.     Mental Status: She is alert and oriented to person, place, and time.     Cranial Nerves: No cranial nerve deficit.  Skin:    General: Skin is warm and dry.  Psychiatric:        Mood and Affect: Mood normal.        Behavior: Behavior normal.        Thought Content: Thought content normal.        Judgment: Judgment normal.  Vitals reviewed.     Assessment/Plan: Encounter for annual routine gynecological examination  Cervical cancer screening - Plan: Cytology - PAP  Screening for HPV (human papillomavirus) - Plan: Cytology - PAP  Encounter for screening mammogram for malignant  neoplasm of breast - pt current on mammo  Family history of ovarian cancer--pt is MyRisk neg; no further screening recommendations  Screening for colon cancer--pt due 3/24 and will call to schedule appt. Will do ref prn.   Anxiety - Rx xanax RF. Pt takes sparingly. - Plan: ALPRAZolam (XANAX) 0.5 MG tablet    Meds ordered this encounter  Medications   ALPRAZolam (XANAX) 0.5 MG tablet    Sig: Take 1 tablet (0.5 mg total) by mouth 2 (two) times daily as needed for anxiety.    Dispense:  30 tablet    Refill:  0    Order Specific Question:   Supervising Provider    Answer:   Renaldo Reel            GYN counsel breast self exam, mammography screening, menopause, adequate intake of calcium and vitamin D, diet and exercise    F/U  Return in about 1 year (around 02/22/2023).  Cashtyn Pouliot B. Junah Yam, PA-C 02/21/2022 11:44 AM

## 2022-02-21 ENCOUNTER — Ambulatory Visit (INDEPENDENT_AMBULATORY_CARE_PROVIDER_SITE_OTHER): Payer: Federal, State, Local not specified - PPO | Admitting: Obstetrics and Gynecology

## 2022-02-21 ENCOUNTER — Other Ambulatory Visit (HOSPITAL_COMMUNITY)
Admission: RE | Admit: 2022-02-21 | Discharge: 2022-02-21 | Disposition: A | Discharge status: Home / Self Care | Payer: Federal, State, Local not specified - PPO | Source: Ambulatory Visit | Attending: Obstetrics and Gynecology | Admitting: Obstetrics and Gynecology

## 2022-02-21 ENCOUNTER — Encounter: Payer: Self-pay | Admitting: Obstetrics and Gynecology

## 2022-02-21 VITALS — BP 150/90 | Ht 60.0 in | Wt 112.0 lb

## 2022-02-21 DIAGNOSIS — Z1231 Encounter for screening mammogram for malignant neoplasm of breast: Secondary | ICD-10-CM | POA: Diagnosis not present

## 2022-02-21 DIAGNOSIS — Z01419 Encounter for gynecological examination (general) (routine) without abnormal findings: Secondary | ICD-10-CM | POA: Diagnosis not present

## 2022-02-21 DIAGNOSIS — Z1151 Encounter for screening for human papillomavirus (HPV): Secondary | ICD-10-CM | POA: Diagnosis present

## 2022-02-21 DIAGNOSIS — Z124 Encounter for screening for malignant neoplasm of cervix: Secondary | ICD-10-CM

## 2022-02-21 DIAGNOSIS — Z1211 Encounter for screening for malignant neoplasm of colon: Secondary | ICD-10-CM

## 2022-02-21 DIAGNOSIS — Z8041 Family history of malignant neoplasm of ovary: Secondary | ICD-10-CM

## 2022-02-21 DIAGNOSIS — F419 Anxiety disorder, unspecified: Secondary | ICD-10-CM

## 2022-02-21 MED ORDER — ALPRAZOLAM 0.5 MG PO TABS: 0.5000 mg | ORAL_TABLET | Freq: Two times a day (BID) | ORAL | 0 refills | Status: DC | PRN

## 2022-02-21 NOTE — Patient Instructions (Signed)
I value your feedback and you entrusting us with your care. If you get a [facility] patient survey, I would appreciate you taking the time to let us know about your experience today. Thank you! ? ? ?

## 2022-02-26 ENCOUNTER — Encounter: Payer: Self-pay | Admitting: Obstetrics and Gynecology

## 2022-02-26 LAB — CYTOLOGY - PAP
Comment: NEGATIVE
Diagnosis: UNDETERMINED — AB
High risk HPV: NEGATIVE

## 2022-04-24 ENCOUNTER — Encounter: Attending: Surgical

## 2022-05-27 NOTE — Telephone Encounter (Signed)
-----   Message from Jeris Penta sent at 05/27/2022  6:49 AM PST -----  Regarding: RESCHEDULE  Pt requested a RS via automated reminder system for appt on 05/28/22.  Please call patient to get this appt rescheduled and off of the DAR.      Thank you

## 2022-05-27 NOTE — Telephone Encounter (Signed)
Spoke with patient to reschedule appointment and made an appointment for 12/19

## 2022-05-28 ENCOUNTER — Encounter: Attending: Surgical

## 2022-06-04 ENCOUNTER — Encounter: Attending: Surgical

## 2022-07-20 ENCOUNTER — Other Ambulatory Visit: Payer: Self-pay | Admitting: Internal Medicine

## 2022-07-23 ENCOUNTER — Ambulatory Visit: Admit: 2022-07-23 | Payer: PRIVATE HEALTH INSURANCE | Attending: Surgical

## 2022-07-23 DIAGNOSIS — R195 Other fecal abnormalities: Secondary | ICD-10-CM

## 2022-07-23 NOTE — Progress Notes (Signed)
General and Colorectal Surgery    HISTORY AND PHYSICAL EXAM    Patient:   Tamara Stout  MRN:    [id number]  Date of Service:  07/23/2022    Chief complaint: I note blood in my stool.    HISTORY OF PRESENT ILLNESS/ INTERVAL UPDATE:  Tamara Stout is a 57 y.o. female who was referred by Bernerd Limbo, MD and is here for evaluation for a colonoscopy.    Tamara Stout last underwent examination by colonoscopy in her early 66s with no polyps identified.    There is no first-degree family member with CRC.  Event has not undergone bowel resection.    She recently underwent Cologuard examination that returned positive results.  She has experienced intermittent and periodic hematochezia that ranges in amount from noted on toilet tissue to covering surface of toilet water.  No melenic stools are reported.  She reports small perirectal lumps and bumps.  There is no history of IBS or IBD.  No unintended weight loss is reported.  She denies stool vacillation.     She denies dyspepsia or dysphagia.  There is no history of PUD or H. pylori.  She denies jaundice, hepatitis or pancreatitis.  She has known GERD with symptoms mitigated by omeprazole.    Bronson Ing Jesie Stenerson does not take blood thinners.  The patient does not have a significant cardiac history.    There is not a history of COPD and the patient does not require supplemenatl oxygen.    BMI is 26.23 kg/m.  GFR is > 60.     PAST MEDICAL HISTORY:   Past Medical History:   Diagnosis Date    Alcohol abuse 10 yrs    Anxiety     Since childhood     Phlebitis 2017       PAST SURGICAL HISTORY:   Past Surgical History:   Procedure Laterality Date    FRACTURE SURGERY  1982       MEDICATIONS:   Current Outpatient Medications on File Prior to Visit   Medication Sig Dispense Refill    MV,CALCIUM,MIN/IRON/FOLIC/VITK (MULTI FOR HER ORAL) Take by mouth once daily.      omeprazole DR (PRILOSEC) 10 mg capsule Take 10 mg by mouth once daily.       No current facility-administered  medications on file prior to visit.       ALLERGIES: No Known Allergies    SOCIAL HISTORY:   Social History     Socioeconomic History    Marital status: Widowed     Spouse name: Not on file    Number of children: Not on file    Years of education: 15    Highest education level: Not on file   Occupational History    Not on file   Tobacco Use    Smoking status: Former     Years: 15     Types: Cigarettes     Quit date: 06/17/2008     Years since quitting: 14.1    Smokeless tobacco: Never   Substance and Sexual Activity    Alcohol use: Yes     Alcohol/week: 7.0 standard drinks of alcohol     Types: 7 Glasses of wine per week    Drug use: Never    Sexual activity: Not Currently     Partners: Male     Birth control/protection: Post-menopausal   Other Topics Concern    Caffeine Concern Yes    Exercise Not Asked  Social History Narrative    Not on file     Social Determinants of Health     Financial Resource Strain: Low Risk  (08/23/2021)    Financial Resource Strain     Difficulty of Paying Living Expenses: Not on file     Access to Reliable Phone: Not on file   Food Insecurity: Not on file   Transportation Needs: Not on file   Physical Activity: Not on file   Stress: Not on file   Social Connections: Not on file   Intimate Partner Violence: Unknown (08/23/2021)    Intimate Partner Violence     Fear of Current or Ex-Partner: Not on file     Emotionally Abused: Not on file     Physically Abused: Not on file     Sexually Abused: Not on file     Questions Apply to Other Individual: Not on file   Housing Stability: Not on file       FAMILY HISTORY:   Family History   Problem Relation Age of Onset    Early death Mother     Heart disease Mother     Cancer Father         non hodgkins lymphoma, large and small cell carcinoma    Early death Father     Cancer Maternal Grandmother         lung    Alcohol abuse Maternal Grandfather     Diabetes Paternal Grandmother     Mental retardation Maternal Uncle      Breast cancer Neg Hx        REVIEW OF SYSTEMS:   A full 10 point review of systems was completed and is negative, except for the pertinent positives and negatives as noted above in the history of present illness.    ============================================================   PHYSICAL EXAM:   CURRENT VITALS:   Vitals:    07/23/22 0820   SpO2: 97%     General: WNWD female, NAD  HENT: Non icteric, normochromic conjunctivae  Neck: Supple, no adenopathy  Thorax:  Non labored, clear  CV: Regular  Abdomen: Non distended     ==========================================================  ASSESSMENT:    ICD-10-CM    1. Positive colorectal cancer screening using Cologuard test  R19.5       2. Hematochezia  K92.1         Tamara Stout is a 57 y.o. female with need for colonoscopy for rectal bleeding.    RECOMMENDATIONS/PLAN:     ---The patient is considered to be at average risk for the development of colon and rectal cancer.  ---Based on her history, she is a candidate for colonoscopy.  ---PARQ discussion regarding risks, benefits, and alternative options for the procedure have been discussed in detail.  We discussed the risks of bleeding, infection, perforation, missed lesions and failure to resolve the issue.  All posed questions were answered, the patient indicated understanding and wishes to proceed with the proposed procedure.     ---Set up for a procedure:    --- Colonoscopy--[id number] (Dr. Molly Maduro)  --- May schedule at [facility]   --- ASA class 2  --- Moderate sedation with propofol  ---The patient is not on blood thinners  --- SuPREP as colon prep      Tamara Stout, MPAS, PA-C  General Surgery  [facility]       Answers for HPI/ROS submitted by the patient on 07/16/2022  Activity change: Yes  Appetite change: No  Chills: No  Excessive Sweating: No  Fatigue: No  Fever: No  Unexpected weight change: No  Congestion: No  Dental problem: No  Drooling: No  Ear discharge: No  Ear pain: No  Facial  swelling: No  Hearing loss: No  Mouth sores: No  Nosebleeds: No  Postnasal drip: No  Runny nose: No  Sinus pressure: No  Sneezing: No  Sore throat: No  Tinnitus (ringing in the ears): No  Trouble swallowing: No  Voice change: No  Eye discharge: No  Eye itching: No  Eye pain: No  Eye redness: No  Photophobia (light sensitivity): No  Visual disturbance: No  Apnea (Slowed or stop breathing): No  Chest tightness: No  Choking: No  Cough: No  Shortness of breath: No  Stridor: No  Wheezing: No  Chest pain: No  Leg swelling: No  Palpitations: No  Abdominal distention: No  Anal bleeding: No  Blood in stool: Yes  Constipation: No  Diarrhea: No  Nausea: No  Rectal pain: No  Vomiting: No  Cold intolerance: No  Heat intolerance: No  Polydipsia (excessive thirst): No  Polyphagia (excessive hunger): No  Polyuria (excessive urination): No  Difficulty urinating: No  Dyspareunia (painful intercourse): No  Dysuria: No  Enuresis (Bed wetting): No  Flank pain: No  Urination Frequency: No  Genital sores: No  Hematuria: No  Menstrual problem: No  Pelvic pain: No  Urinary Urgency: No  Decreased urine volume: No  Vaginal bleeding: No  Vaginal discharge: No  Vaginal pain: No  Arthralgias (joint pain): No  Back pain: No  Gait problem: No  Joint swelling: No  Myalgias: No  Neck pain: No  Neck stiffness: No  Color change: No  Pallor (pale skin): No  Rash: No  Wound: No  Environmental allergies: No  Food allergies: No  Immunocompromised: No  Adenopathy: No  Bruises/bleeds easily: No  Dizziness: No  Facial asymmetry: No  Headaches: No  Lightheadedness: No  Numbness: No  Seizures: No  Speech difficulty: No  Syncope (fainting): No  Tremor: No  Weakness: No  Agitation: No  Behavior problems: No  Confusion: No  Decreased concentration: No  Dysphoric mood: No  Hallucinations: No  Hyperactive: No  Nervous/Anxious: No  Self-injury: No  Sleep disturbance: No  Suicidal ideas: No

## 2022-07-30 MED ORDER — sodium-potassium-magnesium sulfates (SUPREP BOWEL PREP KIT) solution
17.5-3.13-1.6 | ORAL | 0 refills | Status: DC
Start: 2022-07-30 — End: 2022-09-02

## 2022-07-30 NOTE — Addendum Note (Signed)
Addended by: Molly Maduro on: 07/31/2022 12:41 PM     Modules accepted: Orders

## 2022-07-30 NOTE — Telephone Encounter (Signed)
Called patient to schedule colonoscopy.  Scheduled for 09/02/22 @ 0830  suprep bowel prep instructions reviewed verbally with the patient and uploaded via mychart for the patient, address was confirmed.  Rx was sent to   [id number] - [place], [place] - [address]  [place] [address]  Phone: [phone] Fax: [phone]    pharmacy. Patient understands a driver is needed to go home after the procedure is done.  Pt verbalized understanding, has no further questions or concerns. The call was mutually terminated.

## 2022-08-09 ENCOUNTER — Other Ambulatory Visit: Payer: Self-pay | Admitting: Internal Medicine

## 2022-08-09 DIAGNOSIS — Z1211 Encounter for screening for malignant neoplasm of colon: Secondary | ICD-10-CM

## 2022-08-14 ENCOUNTER — Other Ambulatory Visit: Payer: Self-pay | Admitting: Internal Medicine

## 2022-08-14 ENCOUNTER — Other Ambulatory Visit (INDEPENDENT_AMBULATORY_CARE_PROVIDER_SITE_OTHER): Payer: Federal, State, Local not specified - PPO | Admitting: Internal Medicine

## 2022-08-14 DIAGNOSIS — Z1211 Encounter for screening for malignant neoplasm of colon: Secondary | ICD-10-CM | POA: Diagnosis not present

## 2022-08-14 LAB — POC HEMOCCULT BLD/STL (HOME/3-CARD/SCREEN)
Card #2 Fecal Occult Blod, POC: NEGATIVE
Card #3 Fecal Occult Blood, POC: NEGATIVE
Fecal Occult Blood, POC: POSITIVE — AB

## 2022-08-15 ENCOUNTER — Other Ambulatory Visit: Payer: Federal, State, Local not specified - PPO

## 2022-08-15 ENCOUNTER — Other Ambulatory Visit: Payer: Self-pay | Admitting: Internal Medicine

## 2022-08-16 LAB — HEPATIC FUNCTION PANEL
ALT: 11 IU/L (ref 0–32)
AST: 19 IU/L (ref 0–40)
Albumin: 4.8 g/dL (ref 3.8–4.9)
Alkaline Phosphatase: 60 IU/L (ref 44–121)
Bilirubin Total: 0.5 mg/dL (ref 0.0–1.2)
Bilirubin, Direct: 0.15 mg/dL (ref 0.00–0.40)
Total Protein: 7.5 g/dL (ref 6.0–8.5)

## 2022-08-16 LAB — LIPID PANEL W/O CHOL/HDL RATIO
Cholesterol, Total: 252 mg/dL — ABNORMAL HIGH (ref 100–199)
HDL: 69 mg/dL (ref >39)
LDL Chol Calc (NIH): 156 mg/dL — ABNORMAL HIGH (ref 0–99)
Triglycerides: 153 mg/dL — ABNORMAL HIGH (ref 0–149)
VLDL Cholesterol Cal: 27 mg/dL (ref 5–40)

## 2022-08-16 LAB — CK: Total CK: 45 U/L (ref 32–182)

## 2022-08-16 LAB — HGB A1C W/O EAG: Hgb A1c MFr Bld: 6.3 % — ABNORMAL HIGH (ref 4.8–5.6)

## 2022-08-18 NOTE — ED Triage Notes (Signed)
Triage Note: Patient presents to the emergency department c/o mechanical GLF this am onto concrete while gardening.  Patient denies LOC and not on blood thinners.  Abrasions noted to LEFT side of face, denies vision changes to LEFT eye.         Residence: Private residence    Facility Name:   Address:   Contact Number:     Isolation Initiated:   Mobility:  Ambulatory (independent)    Transportation type: Psychologist, counselling with Anyone:    Brien Mates has a no Patent attorney, Do you currently have any weapons, of any type, in your possession: no         Has the patient had metal detection screening via metal detector or wand?: no

## 2022-08-19 ENCOUNTER — Other Ambulatory Visit: Payer: Self-pay | Admitting: Internal Medicine

## 2022-08-19 ENCOUNTER — Other Ambulatory Visit (INDEPENDENT_AMBULATORY_CARE_PROVIDER_SITE_OTHER): Payer: Federal, State, Local not specified - PPO | Admitting: Internal Medicine

## 2022-08-19 DIAGNOSIS — Z1211 Encounter for screening for malignant neoplasm of colon: Secondary | ICD-10-CM

## 2022-08-19 LAB — POC HEMOCCULT BLD/STL (HOME/3-CARD/SCREEN)
Card #2 Fecal Occult Blod, POC: NEGATIVE
Card #3 Fecal Occult Blood, POC: NEGATIVE
Fecal Occult Blood, POC: NEGATIVE

## 2022-08-20 ENCOUNTER — Encounter: Payer: Self-pay | Admitting: Internal Medicine

## 2022-08-20 ENCOUNTER — Ambulatory Visit: Payer: Federal, State, Local not specified - PPO | Admitting: Internal Medicine

## 2022-08-20 VITALS — BP 140/90 | HR 78 | Ht 59.75 in | Wt 115.6 lb

## 2022-08-20 DIAGNOSIS — F419 Anxiety disorder, unspecified: Secondary | ICD-10-CM | POA: Diagnosis not present

## 2022-08-20 DIAGNOSIS — E782 Mixed hyperlipidemia: Secondary | ICD-10-CM | POA: Diagnosis not present

## 2022-08-20 DIAGNOSIS — R7303 Prediabetes: Secondary | ICD-10-CM | POA: Diagnosis not present

## 2022-08-20 DIAGNOSIS — Z1211 Encounter for screening for malignant neoplasm of colon: Secondary | ICD-10-CM | POA: Diagnosis not present

## 2022-08-20 NOTE — Progress Notes (Signed)
Established Patient Office Visit  Subjective:  Patient ID: Crystal Li, female    DOB: March 18, 1966  Age: 57 y.o. MRN: [id number]  Chief Complaint  Patient presents with   Follow-up    3 mo fu    No new complaints, here for lab review and medication refills. Repeat stool occult was negative. Labs reviewed and notable for deterioration in TC, LDL and A1c to which she admits dietary indiscretion.     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   Wears contact lenses     Social History   Socioeconomic History   Marital status: Married    Spouse name: Not on file   Number of children: Not on file   Years of education: Not on file   Highest education level: Not on file  Occupational History   Not on file  Tobacco Use   Smoking status: Never   Smokeless tobacco: Never  Vaping Use   Vaping Use: Never used  Substance and Sexual Activity   Alcohol use: Yes    Alcohol/week: 7.0 standard drinks of alcohol    Types: 7 Glasses of wine per week   Drug use: No   Sexual activity: Yes    Birth control/protection: None, Post-menopausal  Other Topics Concern   Not on file  Social History Narrative   Not on file   Social Determinants of Health   Financial Resource Strain: Not on file  Food Insecurity: Not on file  Transportation Needs: Not on file  Physical Activity: Insufficiently Active (07/10/2017)   Exercise Vital Sign    Days of Exercise per Week: 2 days    Minutes of Exercise per Session: 20 min  Stress: No Stress Concern  Present (07/10/2017)   [place]    Feeling of Stress : Not at all  Social Connections: Moderately Integrated (07/10/2017)   Social Connection and Isolation Panel [NHANES]    Frequency of Communication with Friends and Family: More than three times a week    Frequency of Social Gatherings with Friends and Family: Twice a week    Attends Religious Services: 1 to 4 times per year    Active Member of [employer] or Organizations: No    Attends Archivist Meetings: Never    Marital Status: Married  Human resources officer Violence: Not At Risk (07/10/2017)   Humiliation, Afraid, Rape, and Kick questionnaire    Fear of Current or Ex-Partner: No    Emotionally Abused: No    Physically Abused: No    Sexually Abused: No    Family History  Problem Relation Age of Onset   Ovarian cancer Mother 61       2014, insurance would not cover testing. Sister is BRCA Neg   Hypertension Mother    Hypertension Father     Allergies  Allergen Reactions   Simvastatin Anaphylaxis    Review of Systems  Constitutional: Negative.  HENT: Negative.    Eyes: Negative.   Respiratory: Negative.    Cardiovascular: Negative.   Gastrointestinal: Negative.   Genitourinary: Negative.   Skin: Negative.   Neurological: Negative.   Endo/Heme/Allergies: Negative.        Objective:   BP (!) 140/90   Pulse 78   Ht 4' 11.75" (1.518 m)   Wt 115 lb 9.6 oz (52.4 kg)   SpO2 98%   BMI 22.77 kg/m   Vitals:   08/20/22 1051  BP: (!) 140/90  Pulse: 78  Height: 4' 11.75" (1.518 m)  Weight: 115 lb 9.6 oz (52.4 kg)  SpO2: 98%  BMI (Calculated): 22.76    Physical Exam Vitals reviewed.  Constitutional:      General: She is not in acute distress. HENT:     Head: Normocephalic.     Nose: Nose normal.     Mouth/Throat:     Mouth: Mucous membranes are moist.  Eyes:     Extraocular Movements: Extraocular movements intact.     Pupils: Pupils  are equal, round, and reactive to light.  Cardiovascular:     Rate and Rhythm: Normal rate and regular rhythm.     Heart sounds: No murmur heard. Pulmonary:     Effort: Pulmonary effort is normal.     Breath sounds: No rhonchi or rales.  Abdominal:     General: Abdomen is flat.     Palpations: There is no hepatomegaly, splenomegaly or mass.  Musculoskeletal:        General: Normal range of motion.     Cervical back: Normal range of motion. No tenderness.  Skin:    General: Skin is warm and dry.  Neurological:     General: No focal deficit present.     Mental Status: She is alert and oriented to person, place, and time.     Cranial Nerves: No cranial nerve deficit.     Motor: No weakness.  Psychiatric:        Mood and Affect: Mood normal.        Behavior: Behavior normal.      No results found for any visits on 08/20/22.  Recent Results (from the past 2160 hour(Tylor Gambrill))  POC Hemoccult Bld/Stl (3-Cd Home Screen)     Status: Abnormal   Collection Time: 08/14/22 12:53 PM  Result Value Ref Range   Card #1 Date 08/07/2022    Fecal Occult Blood, POC Positive (A) Negative   Card #2 Date 08/07/2022    Card #2 Fecal Occult Blod, POC Negative    Card #3 Date 08/07/2022    Card #3 Fecal Occult Blood, POC Negative   Lipid Panel w/o Chol/HDL Ratio     Status: Abnormal   Collection Time: 08/15/22 10:53 AM  Result Value Ref Range   Cholesterol, Total 252 (H) 100 - 199 mg/dL   Triglycerides 153 (H) 0 - 149 mg/dL   HDL 69 >39 mg/dL   VLDL Cholesterol Cal 27 5 - 40 mg/dL   LDL Chol Calc (NIH) 156 (H) 0 - 99 mg/dL  Hepatic function panel     Status: None   Collection Time: 08/15/22 10:53 AM  Result Value Ref Range   Total Protein 7.5 6.0 - 8.5 g/dL   Albumin 4.8 3.8 - 4.9 g/dL   Bilirubin Total 0.5 0.0 - 1.2 mg/dL   Bilirubin, Direct 0.15 0.00 - 0.40 mg/dL   Alkaline Phosphatase 60 44 - 121 IU/L   AST 19 0 - 40 IU/L  ALT 11 0 - 32 IU/L  Hgb A1c w/o eAG     Status: Abnormal    Collection Time: 08/15/22 10:53 AM  Result Value Ref Range   Hgb A1c MFr Bld 6.3 (H) 4.8 - 5.6 %    Comment:          Prediabetes: 5.7 - 6.4          Diabetes: >6.4          Glycemic control for adults with diabetes: <7.0   CK     Status: None   Collection Time: 08/15/22 10:53 AM  Result Value Ref Range   Total CK 45 32 - 182 U/L  POC Hemoccult Bld/Stl (3-Cd Home Screen)     Status: Normal   Collection Time: 08/19/22  2:33 PM  Result Value Ref Range   Card #1 Date 08/14/2022    Fecal Occult Blood, POC Negative Negative   Card #2 Date 08/15/2022    Card #2 Fecal Occult Blod, POC Negative    Card #3 Date 08/16/2022    Card #3 Fecal Occult Blood, POC Negative       Assessment & Plan:   Problem List Items Addressed This Visit   None 1. Anxiety  2. Mixed hyperlipidemia - Lipid panel; Future - Uric acid; Future  3. Prediabetes - Hemoglobin A1c; Future  4. Colon cancer screening    No follow-ups on file.   Total time spent: 20 minutes  Volanda Napoleon, MD  08/20/2022

## 2022-08-20 NOTE — Addendum Note (Signed)
Addended by: Azzie Roup AHMAD on: 08/20/2022 11:11 AM   Modules accepted: Level of Service

## 2022-09-02 LAB — TISSUE EXAM

## 2022-09-02 MED ORDER — lactated Ringer's infusion
INTRAVENOUS | Status: DC
Start: 2022-09-02 — End: 2022-09-02
  Administered 2022-09-02 (×2): via INTRAVENOUS

## 2022-09-02 MED ORDER — sterile water irrigation (bottle)
Status: DC | PRN
Start: 2022-09-02 — End: 2022-09-02
  Administered 2022-09-02: 15:00:00

## 2022-09-02 MED ORDER — propofoL (DIPRIVAN) 10 mg/mL injection
10 | INTRAVENOUS | Status: AC
Start: 2022-09-02 — End: ?

## 2022-09-02 MED ORDER — propofoL (DIPRIVAN) injection
10 | INTRAVENOUS | Status: DC | PRN
Start: 2022-09-02 — End: 2022-09-02
  Administered 2022-09-02 (×29): 10 mg/mL via INTRAVENOUS

## 2022-09-02 MED FILL — PROPOFOL 10 MG/ML INTRAVENOUS EMULSION: 10 mg/mL | INTRAVENOUS | Qty: 60

## 2022-09-02 NOTE — Procedures (Signed)
[facility]  Patient Name: Tamara Stout  MRN: [id number]  Date of Birth: Dec 27, 1965  Attending MD: Molly Maduro ,   Procedure Date No Time: 09/02/2022  Age: 57  Procedure:         Colonoscopy  Indications:       Positive Cologuard test  Referring MD:        Medicines:         See the Anesthesia note for documentation of the                      administered medications, Please refer to the [facility] in the                      patient Electronic Medical Record.  Complications:     No immediate complications.  Procedure:         After I obtained informed consent, the scope was passed                      under direct vision. Throughout the procedure, the                      patient's blood pressure, pulse, and oxygen saturations                      were monitored continuously. The Colonoscope: [id number] -                      #[id number] was introduced through the anus and advanced to                      the cecum, identified by appendiceal orifice and ileocecal                      valve. The colonoscopy was performed without difficulty.                      The patient tolerated the procedure well. The quality of                      the bowel preparation was good.  Findings:       The perianal and digital rectal examinations were normal.       Two pedunculated polyps were found in the sigmoid colon (3cm) and        descending colon (1.2cm). The polyps were 12 to 30 mm in size. These        polyps were each removed with a hot snare at the base of the stalk.        Resection and retrieval were complete and whole - not piecemeal. To        close the two defects after polypectomy, three hemostatic clips (2 at        the sigmoid larger polypectomy site and 1 for the descending to close        the defect after polypectomy) were successfully placed. There was no        bleeding at the end of the procedure. Both areas were tattooed with an        injection of 1 mL total of [place] ink.       A few  small-mouthed diverticula were found in the ascending colon. There        was  no evidence of diverticular bleeding.       Two semi-sessile polyps were found in the ascending colon. The polyps        were 4-8 mm in size. These polyps were removed with a hot snare.        Resection and retrieval were complete. Estimated blood loss was minimal.       Five sessile polyps were found in the sigmoid colon. The polyps were 3        to 4 mm in size. Fulguration to ablate the lesion by monopolar probe was        successful.  Impression:        - Two 12 to 30 mm polyps in the sigmoid colon and in the                      descending colon, removed with a hot snare. Resected and                      retrieved. Clips were placed. Tattooed.                     - Diverticulosis in the ascending colon. There was no                      evidence of diverticular bleeding.                     - Two 8 mm polyps in the ascending colon, removed with a                      hot snare. Resected and retrieved.                     - Five 3 to 4 mm polyps in the sigmoid colon. Treated with                      a monopolar probe.  Recommendation:       - Repeat colonoscopy in 6 months for surveillance based on pathology        results.  Moderate Sedation:       Moderate (conscious) sedation was administered by the endoscopy nurse        and supervised by the endoscopist. The following parameters were        monitored: oxygen saturation, heart rate, blood pressure, and response        to care.  Molly Maduro,   09/02/2022 9:30:20 AM  This report has been signed electronically.  Number of Addenda: 0       [facility]  Case Number: [id number]
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
## 2022-09-23 ENCOUNTER — Encounter: Payer: PRIVATE HEALTH INSURANCE | Attending: MD

## 2022-09-30 NOTE — Telephone Encounter (Addendum)
Spoke to patient and reviewed results. Patient verbalized understanding and agrees to plan. Recalls placed.     ----- Message from Molly Maduro, MD sent at 09/29/2022  5:25 PM PDT -----  Good news about the final pathology (which took a long time to return because the pathology sent for an outside consultation). 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   Repeat colonoscopy in 6 months for surveillance given the large nature of these polyps. We will likely be able to do a 3 year follow up after that.

## 2022-10-07 ENCOUNTER — Other Ambulatory Visit: Payer: Self-pay | Admitting: Internal Medicine

## 2022-10-07 DIAGNOSIS — I10 Essential (primary) hypertension: Secondary | ICD-10-CM

## 2022-10-23 ENCOUNTER — Other Ambulatory Visit: Payer: Self-pay | Admitting: Internal Medicine

## 2022-10-23 DIAGNOSIS — E785 Hyperlipidemia, unspecified: Secondary | ICD-10-CM

## 2022-10-29 ENCOUNTER — Telehealth: Payer: Self-pay

## 2022-10-29 NOTE — Telephone Encounter (Signed)
Pt called and left vm requesting a call back regarding rx Nexlizet, needing refill

## 2022-10-31 ENCOUNTER — Telehealth: Payer: Self-pay | Admitting: Internal Medicine

## 2022-10-31 NOTE — Telephone Encounter (Signed)
Spoke to [place] at [facility] and it was a quantity limitations exceeded for the 90 DS. Has been getting a 30 DS at a time. That is what they are going to fill it for. Patient notified.  Patient left VM that [facility] [address] will not fill her Nexlizet.

## 2022-11-18 ENCOUNTER — Other Ambulatory Visit: Payer: Federal, State, Local not specified - PPO

## 2022-11-18 DIAGNOSIS — E782 Mixed hyperlipidemia: Secondary | ICD-10-CM

## 2022-11-18 DIAGNOSIS — R7303 Prediabetes: Secondary | ICD-10-CM

## 2022-11-19 LAB — LIPID PANEL
Chol/HDL Ratio: 3.7 ratio (ref 0.0–4.4)
Cholesterol, Total: 235 mg/dL — ABNORMAL HIGH (ref 100–199)
HDL: 64 mg/dL (ref >39)
LDL Chol Calc (NIH): 120 mg/dL — ABNORMAL HIGH (ref 0–99)
Triglycerides: 294 mg/dL — ABNORMAL HIGH (ref 0–149)
VLDL Cholesterol Cal: 51 mg/dL — ABNORMAL HIGH (ref 5–40)

## 2022-11-19 LAB — HEMOGLOBIN A1C
Est. average glucose Bld gHb Est-mCnc: 123 mg/dL
Hgb A1c MFr Bld: 5.9 % — ABNORMAL HIGH (ref 4.8–5.6)

## 2022-11-19 LAB — URIC ACID: Uric Acid: 6.6 mg/dL (ref 3.0–7.2)

## 2022-11-20 ENCOUNTER — Ambulatory Visit: Payer: Federal, State, Local not specified - PPO | Admitting: Internal Medicine

## 2022-11-20 ENCOUNTER — Encounter: Payer: Self-pay | Admitting: Internal Medicine

## 2022-11-20 VITALS — BP 126/84 | HR 88 | Ht 59.0 in | Wt 114.4 lb

## 2022-11-20 DIAGNOSIS — E782 Mixed hyperlipidemia: Secondary | ICD-10-CM

## 2022-11-20 DIAGNOSIS — I10 Essential (primary) hypertension: Secondary | ICD-10-CM | POA: Diagnosis not present

## 2022-11-20 DIAGNOSIS — R7303 Prediabetes: Secondary | ICD-10-CM | POA: Insufficient documentation

## 2022-11-20 NOTE — Progress Notes (Signed)
Established Patient Office Visit  Subjective:  Patient ID: Crystal Li, female    DOB: 02-Jun-1966  Age: 57 y.o. MRN: [id number]  Chief Complaint  Patient presents with   Follow-up    3 month follow up, discuss lab results.    No new complaints, here for lab review and medication refills. Labs reviewed and notable for slight improvement in LDL and TC but still uncontrolled and triglycerides deteriorated to which he admits dietary indiscretion.    No other concerns at this time.   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   Wears contact lenses     Past Surgical History:  Procedure Laterality Date   COLONOSCOPY WITH PROPOFOL N/A 09/14/2019   Procedure: COLONOSCOPY WITH PROPOFOL;  Surgeon: Toney Reil, MD;  Location: [facility];  Service: Endoscopy;  Laterality: N/A;  Priority 4   CRYOTHERAPY  1985   ENDOMETRIAL ABLATION W/ NOVASURE  11/2009   ENDOMETRIAL BIOPSY  10/2009   hysteroscopy D&C  2011   POLYPECTOMY  09/14/2019   Procedure: POLYPECTOMY;  Surgeon: Toney Reil, MD;  Location: [facility];  Service: Endoscopy;;   SKIN CANCER EXCISION      Social History   Socioeconomic History   Marital status: Married    Spouse name: Not on file   Number of children: Not on file   Years of education: Not on file   Highest education level: Not on file  Occupational History   Not on file  Tobacco Use   Smoking status: Never   Smokeless tobacco: Never  Vaping Use   Vaping Use: Never used  Substance  and Sexual Activity   Alcohol use: Yes    Alcohol/week: 7.0 standard drinks of alcohol    Types: 7 Glasses of wine per week   Drug use: No   Sexual activity: Yes    Birth control/protection: None, Post-menopausal  Other Topics Concern   Not on file  Social History Narrative   Not on file   Social Determinants of Health   Financial Resource Strain: Not on file  Food Insecurity: Not on file  Transportation Needs: Not on file  Physical Activity: Insufficiently Active (07/10/2017)   Exercise Vital Sign    Days of Exercise per Week: 2 days    Minutes of Exercise per Session: 20 min  Stress: No Stress Concern Present (07/10/2017)   [employer] of Occupational Health - Occupational Stress Questionnaire    Feeling of Stress : Not at all  Social Connections: Moderately Integrated (07/10/2017)   Social Connection and Isolation Panel [NHANES]    Frequency of Communication with Friends and Family: More than three times a week    Frequency of Social Gatherings with Friends and Family: Twice a week    Attends Religious Services: 1 to 4 times per year    Active Member of [employer] or Organizations: No    Attends Banker Meetings: Never    Marital  Status: Married  Catering manager Violence: Not At Risk (07/10/2017)   Humiliation, Afraid, Rape, and Kick questionnaire    Fear of Current or Ex-Partner: No    Emotionally Abused: No    Physically Abused: No    Sexually Abused: No    Family History  Problem Relation Age of Onset   Ovarian cancer Mother 83       2014, insurance would not cover testing. Sister is BRCA Neg   Hypertension Mother    Hypertension Father     Allergies  Allergen Reactions   Simvastatin Anaphylaxis    Review of Systems  Constitutional: Negative.   HENT: Negative.    Eyes: Negative.   Respiratory: Negative.    Cardiovascular: Negative.   Gastrointestinal: Negative.   Genitourinary: Negative.   Skin: Negative.   Neurological: Negative.    Endo/Heme/Allergies: Negative.        Objective:   BP 126/84   Pulse 88   Ht 4\' 11"  (1.499 m)   Wt 114 lb 6.4 oz (51.9 kg)   SpO2 93%   BMI 23.11 kg/m   Vitals:   11/20/22 0905  BP: 126/84  Pulse: 88  Height: 4\' 11"  (1.499 m)  Weight: 114 lb 6.4 oz (51.9 kg)  SpO2: 93%  BMI (Calculated): 23.09    Physical Exam Vitals reviewed.  Constitutional:      General: She is not in acute distress. HENT:     Head: Normocephalic.     Nose: Nose normal.     Mouth/Throat:     Mouth: Mucous membranes are moist.  Eyes:     Extraocular Movements: Extraocular movements intact.     Pupils: Pupils are equal, round, and reactive to light.  Cardiovascular:     Rate and Rhythm: Normal rate and regular rhythm.     Heart sounds: No murmur heard. Pulmonary:     Effort: Pulmonary effort is normal.     Breath sounds: No rhonchi or rales.  Abdominal:     General: Abdomen is flat.     Palpations: There is no hepatomegaly, splenomegaly or mass.  Musculoskeletal:        General: Normal range of motion.     Cervical back: Normal range of motion. No tenderness.  Skin:    General: Skin is warm and dry.  Neurological:     General: No focal deficit present.     Mental Status: She is alert and oriented to person, place, and time.     Cranial Nerves: No cranial nerve deficit.     Motor: No weakness.  Psychiatric:        Mood and Affect: Mood normal.        Behavior: Behavior normal.      No results found for any visits on 11/20/22.  Recent Results (from the past 2160 hour(Shimon Trowbridge))  Uric acid     Status: None   Collection Time: 11/18/22  9:13 AM  Result Value Ref Range   Uric Acid 6.6 3.0 - 7.2 mg/dL    Comment:            Therapeutic target for gout patients: <6.0  Hemoglobin A1c     Status: Abnormal   Collection Time: 11/18/22  9:13 AM  Result Value Ref Range   Hgb A1c MFr Bld 5.9 (H) 4.8 - 5.6 %    Comment:          Prediabetes: 5.7 - 6.4          Diabetes: >6.4  Glycemic  control for adults with diabetes: <7.0    Est. average glucose Bld gHb Est-mCnc 123 mg/dL  Lipid panel     Status: Abnormal   Collection Time: 11/18/22  9:13 AM  Result Value Ref Range   Cholesterol, Total 235 (H) 100 - 199 mg/dL   Triglycerides 161 (H) 0 - 149 mg/dL   HDL 64 >09 mg/dL   VLDL Cholesterol Cal 51 (H) 5 - 40 mg/dL   LDL Chol Calc (NIH) 604 (H) 0 - 99 mg/dL   Chol/HDL Ratio 3.7 0.0 - 4.4 ratio    Comment:                                   T. Chol/HDL Ratio                                             Men  Women                               1/2 Avg.Risk  3.4    3.3                                   Avg.Risk  5.0    4.4                                2X Avg.Risk  9.6    7.1                                3X Avg.Risk 23.4   11.0       Assessment & Plan:   As per problem list Stricter low cholesterol diet.  Problem List Items Addressed This Visit       Cardiovascular and Mediastinum   Primary hypertension     Other   Mixed hyperlipidemia   Relevant Orders   Lipid panel   Prediabetes - Primary   Relevant Orders   Hemoglobin A1c    Return in about 3 months (around 02/20/2023) for fu with labs prior.   Total time spent: 20 minutes  Luna Fuse, MD  11/20/2022   This document may have been prepared by [facility] Voice Recognition software and as such may include unintentional dictation errors.

## 2022-11-27 ENCOUNTER — Other Ambulatory Visit: Payer: Self-pay | Admitting: Internal Medicine

## 2022-11-27 DIAGNOSIS — I10 Essential (primary) hypertension: Secondary | ICD-10-CM

## 2022-11-27 MED ORDER — CHLORTHALIDONE 25 MG PO TABS: ORAL_TABLET | ORAL | 0 refills | Status: DC

## 2022-11-27 NOTE — Telephone Encounter (Signed)
Patient needs refill of chorthalidone. Rx pended for your approval.

## 2023-01-17 ENCOUNTER — Other Ambulatory Visit: Payer: Self-pay | Admitting: Internal Medicine

## 2023-01-28 NOTE — Telephone Encounter (Signed)
Pt called in to schedule a colonoscopy she is on recall for 03/05/23 with Dr Orie Rout.  Please contact this Pt at [phone]  ok to leave a message

## 2023-02-03 MED ORDER — sodium sulfate, magnesium sulfate, potassium chloride (SUTAB) tablet
1.479-0.188- | ORAL_TABLET | ORAL | 0 refills | 30.00000 days | Status: DC
Start: 2023-02-03 — End: 2023-03-28

## 2023-02-03 NOTE — Telephone Encounter (Signed)
TC to patient and scheduled her with Dr. Orie Rout on 10/11 @ 1230. Instructions sent via MyChart. Patient opts for Sutab this time and Rx was sent to [facility] [address]. Coupon attached to instructions on MyChart.

## 2023-02-03 NOTE — Addendum Note (Signed)
Addended by: Donata Duff on: 02/03/2023 10:39 AM     Modules accepted: Orders

## 2023-02-05 ENCOUNTER — Other Ambulatory Visit: Payer: Self-pay

## 2023-02-05 DIAGNOSIS — E785 Hyperlipidemia, unspecified: Secondary | ICD-10-CM

## 2023-02-05 MED ORDER — NEXLIZET 180-10 MG PO TABS
1.0000 | ORAL_TABLET | Freq: Every day | ORAL | 1 refills | Status: DC
Start: 2023-02-05 — End: 2023-05-23

## 2023-02-18 ENCOUNTER — Other Ambulatory Visit: Payer: Federal, State, Local not specified - PPO

## 2023-02-18 DIAGNOSIS — R7303 Prediabetes: Secondary | ICD-10-CM

## 2023-02-18 DIAGNOSIS — E782 Mixed hyperlipidemia: Secondary | ICD-10-CM

## 2023-02-19 LAB — LIPID PANEL
Chol/HDL Ratio: 4.1 ratio (ref 0.0–4.4)
Cholesterol, Total: 250 mg/dL — ABNORMAL HIGH (ref 100–199)
HDL: 61 mg/dL (ref >39)
LDL Chol Calc (NIH): 150 mg/dL — ABNORMAL HIGH (ref 0–99)
Triglycerides: 219 mg/dL — ABNORMAL HIGH (ref 0–149)
VLDL Cholesterol Cal: 39 mg/dL (ref 5–40)

## 2023-02-19 LAB — HEMOGLOBIN A1C
Est. average glucose Bld gHb Est-mCnc: 126 mg/dL
Hgb A1c MFr Bld: 6 % — ABNORMAL HIGH (ref 4.8–5.6)

## 2023-02-21 ENCOUNTER — Ambulatory Visit: Payer: Federal, State, Local not specified - PPO | Admitting: Internal Medicine

## 2023-02-21 ENCOUNTER — Encounter: Payer: Self-pay | Admitting: Internal Medicine

## 2023-02-21 VITALS — BP 139/68 | HR 98 | Ht 70.0 in | Wt 115.4 lb

## 2023-02-21 DIAGNOSIS — I10 Essential (primary) hypertension: Secondary | ICD-10-CM

## 2023-02-21 DIAGNOSIS — R7303 Prediabetes: Secondary | ICD-10-CM

## 2023-02-21 DIAGNOSIS — E782 Mixed hyperlipidemia: Secondary | ICD-10-CM

## 2023-02-21 NOTE — Progress Notes (Signed)
Established Patient Office Visit  Subjective:  Patient ID: Crystal Li, female    DOB: Oct 08, 1965  Age: 57 y.o. MRN: [id number]  No chief complaint on file.   No new complaints, here for lab review and medication refills. LDL and TC poorly controlled on lab review. Triglycerides also elevated. A1c slightly higher but still in the prediabetic range.   No other concerns at this time.   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   Wears contact lenses     Past Surgical History:  Procedure Laterality Date   COLONOSCOPY WITH PROPOFOL N/A 09/14/2019   Procedure: COLONOSCOPY WITH PROPOFOL;  Surgeon: Toney Reil, MD;  Location: [facility];  Service: Endoscopy;  Laterality: N/A;  Priority 4   CRYOTHERAPY  1985   ENDOMETRIAL ABLATION W/ NOVASURE  11/2009   ENDOMETRIAL BIOPSY  10/2009   hysteroscopy D&C  2011   POLYPECTOMY  09/14/2019   Procedure: POLYPECTOMY;  Surgeon: Toney Reil, MD;  Location: [facility];  Service: Endoscopy;;   SKIN CANCER EXCISION      Social History   Socioeconomic History   Marital status: Married    Spouse name: Not on file   Number of children: Not on file   Years of education: Not on file   Highest education level: Not on file  Occupational History   Not on file  Tobacco Use   Smoking status: Never   Smokeless tobacco: Never  Vaping Use   Vaping status: Never Used  Substance and Sexual Activity   Alcohol use: Yes    Alcohol/week: 7.0 standard drinks of alcohol     Types: 7 Glasses of wine per week   Drug use: No   Sexual activity: Yes    Birth control/protection: None, Post-menopausal  Other Topics Concern   Not on file  Social History Narrative   Not on file   Social Determinants of Health   Financial Resource Strain: Not on file  Food Insecurity: Not on file  Transportation Needs: Not on file  Physical Activity: Insufficiently Active (07/10/2017)   Exercise Vital Sign    Days of Exercise per Week: 2 days    Minutes of Exercise per Session: 20 min  Stress: No Stress Concern Present (07/10/2017)   [employer] of Occupational Health - Occupational Stress Questionnaire    Feeling of Stress : Not at all  Social Connections: Moderately Integrated (07/10/2017)   Social Connection and Isolation Panel [NHANES]    Frequency of Communication with Friends and Family: More than three times a week    Frequency of Social Gatherings with Friends and Family: Twice a week    Attends Religious Services: 1 to 4 times per year    Active Member of [employer] or Organizations: No    Attends Banker Meetings: Never    Marital Status: Married  Catering manager Violence: Not At Risk (07/10/2017)   Humiliation, Afraid, Rape, and Kick questionnaire  Fear of Current or Ex-Partner: No    Emotionally Abused: No    Physically Abused: No    Sexually Abused: No    Family History  Problem Relation Age of Onset   Ovarian cancer Mother 30       59, insurance would not cover testing. Sister is BRCA Neg   Hypertension Mother    Hypertension Father     Allergies  Allergen Reactions   Simvastatin Anaphylaxis    Review of Systems  Constitutional: Negative.   HENT: Negative.    Eyes: Negative.   Respiratory: Negative.    Cardiovascular: Negative.   Gastrointestinal: Negative.   Genitourinary: Negative.   Skin: Negative.   Neurological: Negative.   Endo/Heme/Allergies: Negative.        Objective:   BP 139/68   Pulse 98   Ht 5\' 10"   (1.778 m)   Wt 115 lb 6.4 oz (52.3 kg)   SpO2 (!) 75%   BMI 16.56 kg/m   Vitals:   02/21/23 1053  BP: 139/68  Pulse: 98  Height: 5\' 10"  (1.778 m)  Weight: 115 lb 6.4 oz (52.3 kg)  SpO2: (!) 75%  BMI (Calculated): 16.56    Physical Exam Vitals reviewed.  Constitutional:      General: She is not in acute distress. HENT:     Head: Normocephalic.     Nose: Nose normal.     Mouth/Throat:     Mouth: Mucous membranes are moist.  Eyes:     Extraocular Movements: Extraocular movements intact.     Pupils: Pupils are equal, round, and reactive to light.  Cardiovascular:     Rate and Rhythm: Normal rate and regular rhythm.     Heart sounds: No murmur heard. Pulmonary:     Effort: Pulmonary effort is normal.     Breath sounds: No rhonchi or rales.  Abdominal:     General: Abdomen is flat.     Palpations: There is no hepatomegaly, splenomegaly or mass.  Musculoskeletal:        General: Normal range of motion.     Cervical back: Normal range of motion. No tenderness.  Skin:    General: Skin is warm and dry.  Neurological:     General: No focal deficit present.     Mental Status: She is alert and oriented to person, place, and time.     Cranial Nerves: No cranial nerve deficit.     Motor: No weakness.  Psychiatric:        Mood and Affect: Mood normal.        Behavior: Behavior normal.      No results found for any visits on 02/21/23.  Recent Results (from the past 2160 hour(Ricky Gallery))  Hemoglobin A1c     Status: Abnormal   Collection Time: 02/18/23 10:12 AM  Result Value Ref Range   Hgb A1c MFr Bld 6.0 (H) 4.8 - 5.6 %    Comment:          Prediabetes: 5.7 - 6.4          Diabetes: >6.4          Glycemic control for adults with diabetes: <7.0    Est. average glucose Bld gHb Est-mCnc 126 mg/dL  Lipid panel     Status: Abnormal   Collection Time: 02/18/23 10:12 AM  Result Value Ref Range   Cholesterol, Total 250 (H) 100 - 199 mg/dL   Triglycerides 401 (H) 0 - 149 mg/dL    HDL 61 >  39 mg/dL   VLDL Cholesterol Cal 39 5 - 40 mg/dL   LDL Chol Calc (NIH) 161 (H) 0 - 99 mg/dL   Chol/HDL Ratio 4.1 0.0 - 4.4 ratio    Comment:                                   T. Chol/HDL Ratio                                             Men  Women                               1/2 Avg.Risk  3.4    3.3                                   Avg.Risk  5.0    4.4                                2X Avg.Risk  9.6    7.1                                3X Avg.Risk 23.4   11.0       Assessment & Plan:  As per problem list Stricter low calorie diet, low cholesterol and low fat diet and exercise as much as possible.  Problem List Items Addressed This Visit       Cardiovascular and Mediastinum   Primary hypertension     Other   Mixed hyperlipidemia   Prediabetes - Primary    Return in about 3 months (around 05/23/2023) for fu with labs prior.   Total time spent: 20 minutes  Luna Fuse, MD  02/21/2023   This document may have been prepared by [facility] Voice Recognition software and as such may include unintentional dictation errors.

## 2023-02-24 NOTE — Progress Notes (Unsigned)
PCP: Crystal Monday, MD   No chief complaint on file.   HPI:      [name] is a 57 y.o. [phone] who LMP was No LMP recorded. Patient is postmenopausal., presents today for her annual examination.  Her menses are absent due to menopause. She does not have PMB. She does not have vasomotor sx.  Sex activity: infrequently; single partner, contraception - post menopausal status. She does have vaginal dryness and uses lubricants.   Last Pap: 02/21/22 Results were: ASCUS /neg HPV DNA  Hx of STDs: none  Last mammogram: 01/11/22 Results were: normal--routine follow-up in 12 months There is no FH of breast cancer. There is a FH of ovarian cancer in her mom. Her sister is BRCA neg. Pt is MyRisk neg 4/22; IBIS=6.9%/riskscore=4.7%. The patient does do self-breast exams.    Colonoscopy: 08/2019 with Dr. Allegra Li with polyp; repeat due after 3 yrs.  Done 3/24?***  Tobacco use: The patient denies current or previous tobacco use. Alcohol use: nightly drink No drug use Exercise: moderately active  She does get adequate calcium and Vitamin D in her diet.  Labs with PCP. Has done diet changes for wt loss; cholesterol improving. Doing well.  Pt needs Rx RF xanax to take sparingly for occas anxiety.    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   Wears contact lenses     Past Surgical History:  Procedure Laterality Date   COLONOSCOPY WITH PROPOFOL N/A 09/14/2019   Procedure: COLONOSCOPY WITH PROPOFOL;  Surgeon: Crystal Reil, MD;   Location: [facility];  Service: Endoscopy;  Laterality: N/A;  Priority 4   CRYOTHERAPY  1985   ENDOMETRIAL ABLATION W/ NOVASURE  11/2009   ENDOMETRIAL BIOPSY  10/2009   hysteroscopy D&C  2011   POLYPECTOMY  09/14/2019   Procedure: POLYPECTOMY;  Surgeon: Crystal Reil, MD;  Location: [facility];  Service: Endoscopy;;   SKIN CANCER EXCISION      Family History  Problem Relation Age of Onset   Ovarian cancer Mother 42       2014, insurance would not cover testing. Sister is BRCA Neg   Hypertension Mother    Hypertension Father     Social History   Socioeconomic History   Marital status: Married    Spouse name: Not on file   Number of children: Not on file   Years of education: Not on file   Highest education level: Not on file  Occupational History   Not on file  Tobacco Use   Smoking status: Never   Smokeless tobacco: Never  Vaping Use   Vaping status: Never Used  Substance and Sexual Activity   Alcohol use: Yes    Alcohol/week: 7.0 standard drinks of alcohol    Types: 7 Glasses of wine per week   Drug use: No   Sexual activity: Yes    Birth control/protection: None, Post-menopausal  Other Topics Concern  Not on file  Social History Narrative   Not on file   Social Determinants of Health   Financial Resource Strain: Not on file  Food Insecurity: Not on file  Transportation Needs: Not on file  Physical Activity: Insufficiently Active (07/10/2017)   Exercise Vital Sign    Days of Exercise per Week: 2 days    Minutes of Exercise per Session: 20 min  Stress: No Stress Concern Present (07/10/2017)   [employer] of Occupational Health - Occupational Stress Questionnaire    Feeling of Stress : Not at all  Social Connections: Moderately Integrated (07/10/2017)   Social Connection and Isolation Panel [NHANES]    Frequency of Communication with Friends and Family: More than three times a week    Frequency of Social Gatherings with Friends and  Family: Twice a week    Attends Religious Services: 1 to 4 times per year    Active Member of [employer] or Organizations: No    Attends Banker Meetings: Never    Marital Status: Married  Catering manager Violence: Not At Risk (07/10/2017)   Humiliation, Afraid, Rape, and Kick questionnaire    Fear of Current or Ex-Partner: No    Emotionally Abused: No    Physically Abused: No    Sexually Abused: No    No outpatient medications have been marked as taking for the 02/25/23 encounter (Appointment) with Crystal Li, Crystal Muster B, PA-C.      ROS:  Review of Systems  Constitutional:  Negative for fatigue, fever and unexpected weight change.  Respiratory:  Negative for cough, shortness of breath and wheezing.   Cardiovascular:  Negative for chest pain, palpitations and leg swelling.  Gastrointestinal:  Negative for blood in stool, constipation, diarrhea, nausea and vomiting.  Endocrine: Negative for cold intolerance, heat intolerance and polyuria.  Genitourinary:  Negative for dyspareunia, dysuria, flank pain, frequency, genital sores, hematuria, menstrual problem, pelvic pain, urgency, vaginal bleeding, vaginal discharge and vaginal pain.  Musculoskeletal:  Negative for back pain, joint swelling and myalgias.  Skin:  Negative for rash.  Neurological:  Negative for dizziness, syncope, light-headedness, numbness and headaches.  Hematological:  Negative for adenopathy.  Psychiatric/Behavioral:  Positive for agitation. Negative for confusion, sleep disturbance and suicidal ideas. The patient is not nervous/anxious.      Objective: There were no vitals taken for this visit.   Physical Exam Constitutional:      Appearance: She is well-developed.  Genitourinary:     Vulva and rectum normal.     Right Labia: No rash, tenderness or lesions.    Left Labia: No tenderness, lesions or rash.    No vaginal discharge, erythema or tenderness.      Right Adnexa: not tender and no mass  present.    Left Adnexa: not tender and no mass present.    No cervical motion tenderness, friability or polyp.     Uterus is not enlarged or tender.  Rectum:     Guaiac result negative.     No anal fissure or tenderness.  Breasts:    Right: No mass, nipple discharge, skin change or tenderness.     Left: No mass, nipple discharge, skin change or tenderness.  Neck:     Thyroid: No thyromegaly.  Cardiovascular:     Rate and Rhythm: Normal rate and regular rhythm.     Heart sounds: Normal heart sounds. No murmur heard. Pulmonary:     Effort: Pulmonary effort is normal.     Breath sounds: Normal breath sounds.  Abdominal:     Palpations: Abdomen is soft.     Tenderness: There is no abdominal tenderness. There is no guarding or rebound.  Musculoskeletal:        General: Normal range of motion.     Cervical back: Normal range of motion.  Lymphadenopathy:     Cervical: No cervical adenopathy.  Neurological:     General: No focal deficit present.     Mental Status: She is alert and oriented to person, place, and time.     Cranial Nerves: No cranial nerve deficit.  Skin:    General: Skin is warm and dry.  Psychiatric:        Mood and Affect: Mood normal.        Behavior: Behavior normal.        Thought Content: Thought content normal.        Judgment: Judgment normal.  Vitals reviewed.     Assessment/Plan: Encounter for annual routine gynecological examination  Cervical cancer screening - Plan: Cytology - PAP  Screening for HPV (human papillomavirus) - Plan: Cytology - PAP  Encounter for screening mammogram for malignant neoplasm of breast - pt current on mammo  Family history of ovarian cancer--pt is MyRisk neg; no further screening recommendations  Screening for colon cancer--pt due 3/24 and will call to schedule appt. Will do ref prn.   Anxiety - Rx xanax RF. Pt takes sparingly. - Plan: ALPRAZolam (XANAX) 0.5 MG tablet    No orders of the defined types were placed  in this encounter.           GYN counsel breast self exam, mammography screening, menopause, adequate intake of calcium and vitamin D, diet and exercise    F/U  No follow-ups on file.  Kelcee Bjorn Li. Lotta Frankenfield, PA-C 02/24/2023 1:31 PM

## 2023-02-25 ENCOUNTER — Encounter: Payer: Self-pay | Admitting: Obstetrics and Gynecology

## 2023-02-25 ENCOUNTER — Other Ambulatory Visit (HOSPITAL_COMMUNITY)
Admission: RE | Admit: 2023-02-25 | Discharge: 2023-02-25 | Disposition: A | Discharge status: Home / Self Care | Payer: Federal, State, Local not specified - PPO | Source: Ambulatory Visit | Attending: Obstetrics and Gynecology | Admitting: Obstetrics and Gynecology

## 2023-02-25 ENCOUNTER — Ambulatory Visit (INDEPENDENT_AMBULATORY_CARE_PROVIDER_SITE_OTHER): Payer: Federal, State, Local not specified - PPO | Admitting: Obstetrics and Gynecology

## 2023-02-25 VITALS — BP 120/78 | Ht 60.0 in | Wt 115.0 lb

## 2023-02-25 DIAGNOSIS — Z124 Encounter for screening for malignant neoplasm of cervix: Secondary | ICD-10-CM | POA: Diagnosis present

## 2023-02-25 DIAGNOSIS — Z01419 Encounter for gynecological examination (general) (routine) without abnormal findings: Secondary | ICD-10-CM | POA: Diagnosis not present

## 2023-02-25 DIAGNOSIS — Z8041 Family history of malignant neoplasm of ovary: Secondary | ICD-10-CM

## 2023-02-25 DIAGNOSIS — R87610 Atypical squamous cells of undetermined significance on cytologic smear of cervix (ASC-US): Secondary | ICD-10-CM | POA: Insufficient documentation

## 2023-02-25 DIAGNOSIS — Z1211 Encounter for screening for malignant neoplasm of colon: Secondary | ICD-10-CM

## 2023-02-25 DIAGNOSIS — Z1231 Encounter for screening mammogram for malignant neoplasm of breast: Secondary | ICD-10-CM

## 2023-02-25 DIAGNOSIS — F419 Anxiety disorder, unspecified: Secondary | ICD-10-CM

## 2023-02-25 MED ORDER — ALPRAZOLAM 0.5 MG PO TABS
0.5000 mg | ORAL_TABLET | Freq: Two times a day (BID) | ORAL | 0 refills | Status: DC | PRN
Start: 2023-02-25 — End: 2023-10-09

## 2023-02-25 NOTE — Patient Instructions (Signed)
I value your feedback and you entrusting us with your care. If you get a [facility] patient survey, I would appreciate you taking the time to let us know about your experience today. Thank you!  [facility] ([place])--[phone]  

## 2023-02-27 ENCOUNTER — Other Ambulatory Visit: Payer: Self-pay

## 2023-02-27 ENCOUNTER — Telehealth: Payer: Self-pay

## 2023-02-27 DIAGNOSIS — Z86010 Personal history of colonic polyps: Secondary | ICD-10-CM

## 2023-02-27 MED ORDER — NA SULFATE-K SULFATE-MG SULF 17.5-3.13-1.6 GM/177ML PO SOLN: 1.0000 | Freq: Once | ORAL | 0 refills | Status: AC

## 2023-02-27 NOTE — Telephone Encounter (Signed)
Gastroenterology Pre-Procedure Review  Request Date: 04/10/23 Requesting Physician: Dr. Allegra Lai  PATIENT REVIEW QUESTIONS: The patient responded to the following health history questions as indicated:    1. Are you having any GI issues? no 2. Do you have a personal history of Polyps? yes (last colonoscopy 09/14/19 at [facility] with Dr. Allegra Lai recommended repeat in 3 years) 3. Do you have a family history of Colon Cancer or Polyps? no 4. Diabetes Mellitus? no 5. Joint replacements in the past 12 months?no 6. Major health problems in the past 3 months?no 7. Any artificial heart valves, MVP, or defibrillator?no    MEDICATIONS & ALLERGIES:    Patient reports the following regarding taking any anticoagulation/antiplatelet therapy:   Plavix, Coumadin, Eliquis, Xarelto, Lovenox, Pradaxa, Brilinta, or Effient? no Aspirin? no  Patient confirms/reports the following medications:  Current Outpatient Medications  Medication Sig Dispense Refill   ALPRAZolam (XANAX) 0.5 MG tablet Take 1 tablet (0.5 mg total) by mouth 2 (two) times daily as needed for anxiety. 30 tablet 0   Bempedoic Acid-Ezetimibe (NEXLIZET) 180-10 MG TABS Take 1 tablet by mouth daily. 90 tablet 1   carvedilol (COREG CR) 20 MG 24 hr capsule TAKE 1 CAPSULE BY MOUTH EVERY DAY 90 capsule 1   chlorthalidone (HYGROTON) 25 MG tablet TAKE 1 TABLET BY MOUTH EVERY DAY IN THE MORNING 90 tablet 0   Cholecalciferol (VITAMIN D3) 50 MCG (2000 UT) TABS Take by mouth daily.     No current facility-administered medications for this visit.    Patient confirms/reports the following allergies:  Allergies  Allergen Reactions   Simvastatin Anaphylaxis    No orders of the defined types were placed in this encounter.   AUTHORIZATION INFORMATION Primary Insurance: 1D#: Group #:  Secondary Insurance: 1D#: Group #:  SCHEDULE INFORMATION: Date: 04/10/23 Time: Location: [facility]

## 2023-02-27 NOTE — Telephone Encounter (Signed)
Pt call requesting call back to schedule colonoscopy

## 2023-03-03 LAB — CYTOLOGY - PAP
Comment: NEGATIVE
High risk HPV: NEGATIVE

## 2023-03-04 MED ORDER — ESTRADIOL 0.1 MG/GM VA CREA: TOPICAL_CREAM | VAGINAL | 1 refills | Status: DC

## 2023-03-04 NOTE — Addendum Note (Signed)
Addended by: Althea Grimmer B on: 03/04/2023 04:37 PM   Modules accepted: Orders

## 2023-03-12 ENCOUNTER — Ambulatory Visit
Admission: RE | Admit: 2023-03-12 | Discharge: 2023-03-12 | Disposition: A | Discharge status: Home / Self Care | Payer: Federal, State, Local not specified - PPO | Source: Ambulatory Visit | Attending: Obstetrics and Gynecology | Admitting: Obstetrics and Gynecology

## 2023-03-12 DIAGNOSIS — Z1231 Encounter for screening mammogram for malignant neoplasm of breast: Secondary | ICD-10-CM | POA: Insufficient documentation

## 2023-03-28 LAB — TISSUE EXAM

## 2023-03-28 MED ORDER — NaCl 0.9 % (NS flush) syringe 10 mL
INTRAMUSCULAR | Status: DC | PRN
Start: 2023-03-28 — End: 2023-03-28
  Administered 2023-03-28: 19:00:00 via INTRAVENOUS

## 2023-03-28 MED ORDER — simethicone (MYLICON) 40 mg/0.6 mL drops
40 | ORAL | Status: DC | PRN
Start: 2023-03-28 — End: 2023-03-28
  Administered 2023-03-28: 20:00:00 40 mg/0.6 mL via ORAL

## 2023-03-28 MED ORDER — propofoL (DIPRIVAN) injection
10 | INTRAVENOUS | Status: DC | PRN
Start: 2023-03-28 — End: 2023-03-28
  Administered 2023-03-28 (×17): 10 mg/mL via INTRAVENOUS

## 2023-03-28 MED ORDER — lactated Ringer's infusion
INTRAVENOUS | Status: DC
Start: 2023-03-28 — End: 2023-03-28

## 2023-03-28 MED ORDER — sterile water irrigation (bottle)
Status: DC | PRN
Start: 2023-03-28 — End: 2023-03-28
  Administered 2023-03-28: 19:00:00

## 2023-03-28 NOTE — Procedures (Signed)
[facility]  Patient Name: Tamara Stout  MRN: [id number]  Date of Birth: 1965-08-09  Attending MD: Molly Maduro ,   Procedure Date No Time: 03/28/2023  Age: 57  Procedure:         Colonoscopy  Indications:       High risk colon cancer surveillance: Personal history of                      colonic polyps  Referring MD:        Medicines:         See the Anesthesia note for documentation of the                      administered medications, Please refer to the [facility] in the                      patient Electronic Medical Record.  Complications:     No immediate complications.  Procedure:         After I obtained informed consent, the scope was passed                      under direct vision. Throughout the procedure, the                      patient's blood pressure, pulse, and oxygen saturations                      were monitored continuously. The Colonoscope: [id number] -                      #[id number] was introduced through the anus and advanced to                      the cecum, identified by appendiceal orifice and ileocecal                      valve. The colonoscopy was performed without difficulty.                      The patient tolerated the procedure well. The quality of                      the bowel preparation was good.  Findings:       The perianal and digital rectal examinations were normal.       Two sessile polyps were found in the sigmoid colon and ascending colon.        The polyps were 4 to 7 mm in size. These polyps were removed with a cold        biopsy forceps. Resection and retrieval were complete. Estimated blood        loss was minimal.       The exam was otherwise without abnormality.  Impression:        - Two 4 to 7 mm polyps in the sigmoid colon and in the                      ascending colon, removed with a cold biopsy forceps.                      Resected and retrieved.                     -  The examination was otherwise normal.  Recommendation:       - Discharge  patient to home.       - High fiber diet.       - Continue present medications.       - Await pathology results.       - Repeat colonoscopy in 3 years for surveillance based on pathology        results.  Moderate Sedation:       Moderate (conscious) sedation was administered by the endoscopy nurse        and supervised by the endoscopist. The following parameters were        monitored: oxygen saturation, heart rate, blood pressure, and response        to care.  Molly Maduro,   03/28/2023 12:51:43 PM  This report has been signed electronically.  Number of Addenda: 0       [facility]  Case Number: [id number]
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
## 2023-04-03 ENCOUNTER — Other Ambulatory Visit: Payer: Self-pay

## 2023-04-03 ENCOUNTER — Encounter: Payer: Self-pay | Admitting: Gastroenterology

## 2023-04-10 ENCOUNTER — Encounter: Payer: Self-pay | Admitting: Gastroenterology

## 2023-04-10 ENCOUNTER — Ambulatory Visit
Admission: RE | Admit: 2023-04-10 | Discharge: 2023-04-10 | Disposition: A | Discharge status: Home / Self Care | Payer: Federal, State, Local not specified - PPO | Attending: Gastroenterology | Admitting: Gastroenterology

## 2023-04-10 ENCOUNTER — Encounter
Admission: RE | Disposition: A | Discharge status: Home / Self Care | Payer: Self-pay | Source: Home / Self Care | Attending: Gastroenterology

## 2023-04-10 ENCOUNTER — Ambulatory Visit: Payer: Federal, State, Local not specified - PPO | Admitting: Certified Registered"

## 2023-04-10 DIAGNOSIS — E119 Type 2 diabetes mellitus without complications: Secondary | ICD-10-CM | POA: Insufficient documentation

## 2023-04-10 DIAGNOSIS — I10 Essential (primary) hypertension: Secondary | ICD-10-CM | POA: Diagnosis not present

## 2023-04-10 DIAGNOSIS — Z860100 Personal history of colon polyps, unspecified: Secondary | ICD-10-CM

## 2023-04-10 DIAGNOSIS — Z860101 Personal history of adenomatous and serrated colon polyps: Secondary | ICD-10-CM | POA: Insufficient documentation

## 2023-04-10 DIAGNOSIS — Z1211 Encounter for screening for malignant neoplasm of colon: Secondary | ICD-10-CM | POA: Insufficient documentation

## 2023-04-10 HISTORY — PX: COLONOSCOPY WITH PROPOFOL: SHX5780

## 2023-04-10 SURGERY — COLONOSCOPY WITH PROPOFOL
Anesthesia: General

## 2023-04-10 MED ORDER — PROPOFOL 500 MG/50ML IV EMUL
INTRAVENOUS | Status: DC | PRN
  Administered 2023-04-10: 125 ug/kg/min via INTRAVENOUS

## 2023-04-10 MED ORDER — PROPOFOL 10 MG/ML IV BOLUS
INTRAVENOUS | Status: DC | PRN
  Administered 2023-04-10: 30 mg via INTRAVENOUS
  Administered 2023-04-10: 20 mg via INTRAVENOUS
  Administered 2023-04-10: 70 mg via INTRAVENOUS

## 2023-04-10 MED ORDER — LIDOCAINE HCL (CARDIAC) PF 100 MG/5ML IV SOSY
PREFILLED_SYRINGE | INTRAVENOUS | Status: DC | PRN
  Administered 2023-04-10: 50 mg via INTRAVENOUS

## 2023-04-10 MED ORDER — STERILE WATER FOR IRRIGATION IR SOLN
Status: DC | PRN
  Administered 2023-04-10: 120 mL

## 2023-04-10 MED ORDER — SODIUM CHLORIDE 0.9 % IV SOLN: INTRAVENOUS | Status: DC

## 2023-04-10 NOTE — Plan of Care (Signed)
[facility] Tonsillectomy/Adenoidectomy, Postoperative PEDS care plan entered in error.

## 2023-04-10 NOTE — Anesthesia Postprocedure Evaluation (Signed)
Anesthesia Post Note  Patient: Crystal Li  Procedure(s) Performed: COLONOSCOPY WITH PROPOFOL  Patient location during evaluation: Endoscopy Anesthesia Type: General Level of consciousness: awake and alert Pain management: pain level controlled Vital Signs Assessment: post-procedure vital signs reviewed and stable Respiratory status: spontaneous breathing, nonlabored ventilation, respiratory function stable and patient connected to nasal cannula oxygen Cardiovascular status: blood pressure returned to baseline and stable Postop Assessment: no apparent nausea or vomiting Anesthetic complications: no   No notable events documented.   Last Vitals:  Vitals:   04/10/23 1023 04/10/23 1033  BP: (!) 148/76   Pulse:  78  Resp:    Temp: (!) 36.1 C   SpO2:  100%    Last Pain:  Vitals:   04/10/23 1033  TempSrc:   PainSc: 0-No pain                 Cleda Mccreedy Zamariya Neal
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
## 2023-04-10 NOTE — Transfer of Care (Signed)
Immediate Anesthesia Transfer of Care Note  Patient: Crystal Li  Procedure(s) Performed: COLONOSCOPY WITH PROPOFOL  Patient Location: PACU  Anesthesia Type:MAC  Level of Consciousness: awake, alert , and oriented  Airway & Oxygen Therapy: Patient Spontanous Breathing  Post-op Assessment: Report given to RN and Post -op Vital signs reviewed and stable  Post vital signs: stable  Last Vitals:  Vitals Value Taken Time  BP    Temp    Pulse 88 04/10/23 1023  Resp 17 04/10/23 1023  SpO2 99 % 04/10/23 1023  Vitals shown include unfiled device data.  Last Pain:  Vitals:   04/10/23 0909  TempSrc: Temporal  PainSc: 0-No pain         Complications: No notable events documented.

## 2023-04-10 NOTE — Op Note (Signed)
[facility] Gastroenterology Patient Name: Crystal Li Procedure Date: 04/10/2023 9:53 AM MRN: [id number] Account #: [id number] Date of Birth: [birth year] Admit Type: Outpatient Age: 57 Room: [facility] ENDO ROOM 3 Gender: Female Note Status: Finalized Instrument Name: Peds Colonoscope [id number] Procedure:             Colonoscopy Indications:           Surveillance: Personal history of adenomatous polyps                         on last colonoscopy 3 years ago, Last colonoscopy:                         March 2021 Providers:             Toney Reil MD, MD Referring MD:          Toney Reil MD, MD (Referring MD), Silas Flood.                         Ellsworth Lennox, MD (Referring MD) Medicines:             General Anesthesia Complications:         No immediate complications. Estimated blood loss: None. Procedure:             Pre-Anesthesia Assessment:                        - Prior to the procedure, a History and Physical was                         performed, and patient medications and allergies were                         reviewed. The patient is competent. The risks and                         benefits of the procedure and the sedation options and                         risks were discussed with the patient. All questions                         were answered and informed consent was obtained.                         Patient identification and proposed procedure were                         verified by the physician, the nurse, the                         anesthesiologist, the anesthetist and the technician                         in the pre-procedure area in the procedure room in the                         endoscopy suite. Mental Status Examination: alert and  oriented. Airway Examination: normal oropharyngeal                         airway and neck mobility. Respiratory Examination:                         clear to auscultation. CV  Examination: normal.                         Prophylactic Antibiotics: The patient does not require                         prophylactic antibiotics. Prior Anticoagulants: The                         patient has taken no anticoagulant or antiplatelet                         agents. ASA Grade Assessment: II - A patient with mild                         systemic disease. After reviewing the risks and                         benefits, the patient was deemed in satisfactory                         condition to undergo the procedure. The anesthesia                         plan was to use general anesthesia. Immediately prior                         to administration of medications, the patient was                         re-assessed for adequacy to receive sedatives. The                         heart rate, respiratory rate, oxygen saturations,                         blood pressure, adequacy of pulmonary ventilation, and                         response to care were monitored throughout the                         procedure. The physical status of the patient was                         re-assessed after the procedure.                        After obtaining informed consent, the colonoscope was                         passed under direct vision. Throughout the procedure,  the patient's blood pressure, pulse, and oxygen                         saturations were monitored continuously. The                         Colonoscope was introduced through the anus and                         advanced to the the cecum, identified by appendiceal                         orifice and ileocecal valve. The colonoscopy was                         performed without difficulty. The patient tolerated                         the procedure well. The quality of the bowel                         preparation was evaluated using the BBPS [facility] Bowel                         Preparation Scale) with  scores of: Right Colon = 3,                         Transverse Colon = 3 and Left Colon = 3 (entire mucosa                         seen well with no residual staining, small fragments                         of stool or opaque liquid). The total BBPS score                         equals 9. The ileocecal valve, appendiceal orifice,                         and rectum were photographed. Findings:      The perianal and digital rectal examinations were normal. Pertinent       negatives include normal sphincter tone and no palpable rectal lesions.      The entire examined colon appeared normal.      The retroflexed view of the distal rectum and anal verge was normal and       showed no anal or rectal abnormalities. Impression:            - The entire examined colon is normal.                        - The distal rectum and anal verge are normal on                         retroflexion view.                        - No specimens collected. Recommendation:        -  Discharge patient to home (with escort).                        - Resume previous diet today.                        - Continue present medications.                        - Repeat colonoscopy in 10 years for screening                         purposes. Procedure Code(s):     --- Professional ---                        Z6109, Colorectal cancer screening; colonoscopy on                         individual at high risk Diagnosis Code(s):     --- Professional ---                        Z86.010, Personal history of colonic polyps CPT copyright 2022 American Medical Association. All rights reserved. The codes documented in this report are preliminary and upon coder review may  be revised to meet current compliance requirements. Dr. Libby Maw Toney Reil MD, MD 04/10/2023 10:21:14 AM This report has been signed electronically. Number of Addenda: 0 Note Initiated On: 04/10/2023 9:53 AM Scope Withdrawal Time: 0 hours 6 minutes 10  seconds  Total Procedure Duration: 0 hours 8 minutes 54 seconds  Estimated Blood Loss:  Estimated blood loss: none.      [facility]

## 2023-04-10 NOTE — H&P (Signed)
Crystal Repress, MD [address]  [address]  [place], [place] [address]  Main: [phone]  Fax: [phone] Pager: [phone]  Primary Care Physician:  Sherron Monday, MD Primary Gastroenterologist:  Dr. Arlyss Li  Pre-Procedure History & Physical: HPI:  Crystal Li is a 57 y.o. female is here for an colonoscopy.   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   Wears contact lenses     Past Surgical History:  Procedure Laterality Date   COLONOSCOPY WITH PROPOFOL N/A 09/14/2019   Procedure: COLONOSCOPY WITH PROPOFOL;  Surgeon: Toney Reil, MD;  Location: [facility];  Service: Endoscopy;  Laterality: N/A;  Priority 4   CRYOTHERAPY  1985   ENDOMETRIAL ABLATION W/ NOVASURE  11/2009   ENDOMETRIAL BIOPSY  10/2009   hysteroscopy D&C  2011   POLYPECTOMY  09/14/2019   Procedure: POLYPECTOMY;  Surgeon: Toney Reil, MD;  Location: [facility];  Service: Endoscopy;;   SKIN CANCER EXCISION      Prior to Admission medications   Medication Sig Start Date End Date Taking? Authorizing Provider  carvedilol (COREG CR) 20 MG 24 hr capsule TAKE 1 CAPSULE BY MOUTH EVERY DAY 01/17/23  Yes Tejan-Sie, Marcelino Freestone, MD  Cholecalciferol (VITAMIN D3) 50 MCG (2000 UT) TABS Take by mouth daily.   Yes [provider]  ALPRAZolam (XANAX) 0.5 MG tablet Take 1 tablet (0.5 mg total) by mouth 2 (two) times daily as needed for anxiety. 02/25/23   Copland, Ilona Sorrel, PA-C  Bempedoic Acid-Ezetimibe (NEXLIZET) 180-10 MG TABS  Take 1 tablet by mouth daily. 02/05/23   Sherron Monday, MD  chlorthalidone (HYGROTON) 25 MG tablet TAKE 1 TABLET BY MOUTH EVERY DAY IN THE MORNING 11/27/22   Sherron Monday, MD  estradiol (ESTRACE VAGINAL) 0.1 MG/GM vaginal cream Insert 1 g vaginally nightly for 1 week, then once wkly as maintenance 03/04/23   Copland, Alicia B, PA-C    Allergies as of 02/27/2023 - Review Complete 02/27/2023  Allergen Reaction Noted   Simvastatin Anaphylaxis 07/10/2017    Family History  Problem Relation Age of Onset   Ovarian cancer Mother 67       2014, insurance would not cover testing. Sister is BRCA Neg   Hypertension Mother    Hypertension Father     Social History   Socioeconomic History   Marital status: Married    Spouse name: Not on file   Number of children: Not on file   Years of education: Not on file   Highest education level: Not on file  Occupational History   Not on file  Tobacco Use   Smoking status: Never   Smokeless tobacco: Never  Vaping Use   Vaping status: Never Used  Substance and Sexual Activity   Alcohol use: Yes    Alcohol/week: 7.0 standard drinks of alcohol    Types: 7 Glasses of wine per week   Drug use: No  Sexual activity: Yes    Birth control/protection: Post-menopausal  Other Topics Concern   Not on file  Social History Narrative   Not on file   Social Determinants of Health   Financial Resource Strain: Not on file  Food Insecurity: Not on file  Transportation Needs: Not on file  Physical Activity: Insufficiently Active (07/10/2017)   Exercise Vital Sign    Days of Exercise per Week: 2 days    Minutes of Exercise per Session: 20 min  Stress: No Stress Concern Present (07/10/2017)   [employer] of Occupational Health - Occupational Stress Questionnaire    Feeling of Stress : Not at all  Social Connections: Moderately Integrated (07/10/2017)   Social Connection and Isolation Panel [NHANES]    Frequency of Communication with Friends  and Family: More than three times a week    Frequency of Social Gatherings with Friends and Family: Twice a week    Attends Religious Services: 1 to 4 times per year    Active Member of [employer] or Organizations: No    Attends Banker Meetings: Never    Marital Status: Married  Catering manager Violence: Not At Risk (07/10/2017)   Humiliation, Afraid, Rape, and Kick questionnaire    Fear of Current or Ex-Partner: No    Emotionally Abused: No    Physically Abused: No    Sexually Abused: No    Review of Systems: See HPI, otherwise negative ROS  Physical Exam: BP (!) 164/103   Pulse 90   Temp (!) 97 F (36.1 C) (Temporal)   Resp 17   Ht 5' (1.524 m)   Wt 51.3 kg   SpO2 98%   BMI 22.11 kg/m  General:   Alert,  pleasant and cooperative in NAD Head:  Normocephalic and atraumatic. Neck:  Supple; no masses or thyromegaly. Lungs:  Clear throughout to auscultation.    Heart:  Regular rate and rhythm. Abdomen:  Soft, nontender and nondistended. Normal bowel sounds, without guarding, and without rebound.   Neurologic:  Alert and  oriented x4;  grossly normal neurologically.  Impression/Plan: Crystal Li is here for an colonoscopy to be performed for h/o colon adenomas  Risks, benefits, limitations, and alternatives regarding  colonoscopy have been reviewed with the patient.  Questions have been answered.  All parties agreeable.   Lannette Donath, MD  04/10/2023, 9:50 AM

## 2023-05-20 ENCOUNTER — Other Ambulatory Visit: Payer: Federal, State, Local not specified - PPO

## 2023-05-20 DIAGNOSIS — R7303 Prediabetes: Secondary | ICD-10-CM

## 2023-05-20 DIAGNOSIS — E782 Mixed hyperlipidemia: Secondary | ICD-10-CM

## 2023-05-21 ENCOUNTER — Other Ambulatory Visit: Payer: Federal, State, Local not specified - PPO

## 2023-05-22 LAB — HEMOGLOBIN A1C
Est. average glucose Bld gHb Est-mCnc: 120 mg/dL
Hgb A1c MFr Bld: 5.8 % — ABNORMAL HIGH (ref 4.8–5.6)

## 2023-05-22 LAB — LIPID PANEL
Chol/HDL Ratio: 4.2 {ratio} (ref 0.0–4.4)
Cholesterol, Total: 215 mg/dL — ABNORMAL HIGH (ref 100–199)
HDL: 51 mg/dL (ref >39)
LDL Chol Calc (NIH): 111 mg/dL — ABNORMAL HIGH (ref 0–99)
Triglycerides: 308 mg/dL — ABNORMAL HIGH (ref 0–149)
VLDL Cholesterol Cal: 53 mg/dL — ABNORMAL HIGH (ref 5–40)

## 2023-05-22 LAB — URIC ACID: Uric Acid: 6.4 mg/dL (ref 3.0–7.2)

## 2023-05-23 ENCOUNTER — Other Ambulatory Visit: Payer: Self-pay | Admitting: Internal Medicine

## 2023-05-23 ENCOUNTER — Encounter: Payer: Self-pay | Admitting: Internal Medicine

## 2023-05-23 ENCOUNTER — Ambulatory Visit (INDEPENDENT_AMBULATORY_CARE_PROVIDER_SITE_OTHER): Payer: Federal, State, Local not specified - PPO | Admitting: Internal Medicine

## 2023-05-23 VITALS — BP 128/84 | HR 68 | Ht 60.0 in | Wt 117.2 lb

## 2023-05-23 DIAGNOSIS — R7303 Prediabetes: Secondary | ICD-10-CM

## 2023-05-23 DIAGNOSIS — I10 Essential (primary) hypertension: Secondary | ICD-10-CM | POA: Diagnosis not present

## 2023-05-23 DIAGNOSIS — E782 Mixed hyperlipidemia: Secondary | ICD-10-CM

## 2023-05-23 MED ORDER — CHLORTHALIDONE 25 MG PO TABS: ORAL_TABLET | ORAL | 0 refills | Status: DC

## 2023-05-23 MED ORDER — CARVEDILOL PHOSPHATE ER 20 MG PO CP24: 20.0000 mg | ORAL_CAPSULE | Freq: Every day | ORAL | 1 refills | Status: DC

## 2023-05-23 MED ORDER — NEXLIZET 180-10 MG PO TABS: 1.0000 | ORAL_TABLET | Freq: Every day | ORAL | 1 refills | Status: DC

## 2023-05-23 NOTE — Progress Notes (Signed)
Established Patient Office Visit  Subjective:  Patient ID: Crystal Li, female    DOB: Jan 25, 1966  Age: 57 y.o. MRN: [id number]  Chief Complaint  Patient presents with   Follow-up    3 month follow up, discuss lab results.    No new complaints, here for lab review and medication refills. LDL and TC improved on lab review. A1c has also improved despite gaining weight.  No other concerns at this time.   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   Wears contact lenses     Past Surgical History:  Procedure Laterality Date   COLONOSCOPY WITH PROPOFOL N/A 09/14/2019   Procedure: COLONOSCOPY WITH PROPOFOL;  Surgeon: Toney Reil, MD;  Location: [facility];  Service: Endoscopy;  Laterality: N/A;  Priority 4   COLONOSCOPY WITH PROPOFOL N/A 04/10/2023   Procedure: COLONOSCOPY WITH PROPOFOL;  Surgeon: Toney Reil, MD;  Location: [facility] END[facility]COPY;  Service: Gastroenterology;  Laterality: N/A;   CRYOTHERAPY  1985   ENDOMETRIAL ABLATION W/ NOVASURE  11/2009   ENDOMETRIAL BIOPSY  10/2009   hysteroscopy D&C  2011   POLYPECTOMY  09/14/2019   Procedure: POLYPECTOMY;  Surgeon: Toney Reil, MD;  Location: [facility];  Service: Endoscopy;;   SKIN CANCER EXCISION      Social History   Socioeconomic History   Marital status: Married    Spouse name: Not on file   Number of children: Not on file   Years of education: Not on file   Highest education level: Not on file  Occupational History   Not on file   Tobacco Use   Smoking status: Never   Smokeless tobacco: Never  Vaping Use   Vaping status: Never Used  Substance and Sexual Activity   Alcohol use: Yes    Alcohol/week: 7.0 standard drinks of alcohol    Types: 7 Glasses of wine per week   Drug use: No   Sexual activity: Yes    Birth control/protection: Post-menopausal  Other Topics Concern   Not on file  Social History Narrative   Not on file   Social Determinants of Health   Financial Resource Strain: Not on file  Food Insecurity: Not on file  Transportation Needs: Not on file  Physical Activity: Insufficiently Active (07/10/2017)   Exercise Vital Sign    Days of Exercise per Week: 2 days    Minutes of Exercise per Session: 20 min  Stress: No Stress Concern Present (07/10/2017)   [employer] of Occupational Health - Occupational Stress Questionnaire    Feeling of Stress : Not at all  Social Connections: Moderately Integrated (07/10/2017)   Social Connection and Isolation Panel [NHANES]    Frequency of Communication with Friends and Family: More than three times a week    Frequency of Social Gatherings with Friends and Family: Twice a week    Attends Religious Services: 1 to 4 times per year    Active Member of  Clubs or Organizations: No    Attends Banker Meetings: Never    Marital Status: Married  Catering manager Violence: Not At Risk (07/10/2017)   Humiliation, Afraid, Rape, and Kick questionnaire    Fear of Current or Ex-Partner: No    Emotionally Abused: No    Physically Abused: No    Sexually Abused: No    Family History  Problem Relation Age of Onset   Ovarian cancer Mother 57       2014, insurance would not cover testing. Sister is BRCA Neg   Hypertension Mother    Hypertension Father     Allergies  Allergen Reactions   Simvastatin Anaphylaxis    Outpatient Medications Prior to Visit  Medication Sig   ALPRAZolam (XANAX) 0.5 MG tablet Take 1 tablet (0.5 mg total) by mouth 2 (two)  times daily as needed for anxiety.   Cholecalciferol (VITAMIN D3) 50 MCG (2000 UT) TABS Take by mouth daily.   estradiol (ESTRACE VAGINAL) 0.1 MG/GM vaginal cream Insert 1 g vaginally nightly for 1 week, then once wkly as maintenance   [DISCONTINUED] Bempedoic Acid-Ezetimibe (NEXLIZET) 180-10 MG TABS Take 1 tablet by mouth daily.   [DISCONTINUED] carvedilol (COREG CR) 20 MG 24 hr capsule TAKE 1 CAPSULE BY MOUTH EVERY DAY   [DISCONTINUED] chlorthalidone (HYGROTON) 25 MG tablet TAKE 1 TABLET BY MOUTH EVERY DAY IN THE MORNING   No facility-administered medications prior to visit.    Review of Systems  Constitutional: Negative.   HENT: Negative.    Eyes: Negative.   Respiratory: Negative.    Cardiovascular: Negative.   Gastrointestinal: Negative.   Genitourinary: Negative.   Skin: Negative.   Neurological: Negative.   Endo/Heme/Allergies: Negative.        Objective:   BP 128/84   Pulse 68   Ht 5' (1.524 m)   Wt 117 lb 3.2 oz (53.2 kg)   SpO2 98%   BMI 22.89 kg/m   Vitals:   05/23/23 1102 05/23/23 1145  BP: (!) 128/90 128/84  Pulse: 68   Height: 5' (1.524 m)   Weight: 117 lb 3.2 oz (53.2 kg)   SpO2: 98%   BMI (Calculated): 22.89     Physical Exam Vitals reviewed.  Constitutional:      General: She is not in acute distress. HENT:     Head: Normocephalic.     Nose: Nose normal.     Mouth/Throat:     Mouth: Mucous membranes are moist.  Eyes:     Extraocular Movements: Extraocular movements intact.     Pupils: Pupils are equal, round, and reactive to light.  Cardiovascular:     Rate and Rhythm: Normal rate and regular rhythm.     Heart sounds: No murmur heard. Pulmonary:     Effort: Pulmonary effort is normal.     Breath sounds: No rhonchi or rales.  Abdominal:     General: Abdomen is flat.     Palpations: There is no hepatomegaly, splenomegaly or mass.  Musculoskeletal:        General: Normal range of motion.     Cervical back: Normal range of motion. No  tenderness.  Skin:    General: Skin is warm and dry.  Neurological:     General: No focal deficit present.     Mental Status: She is alert and oriented to person, place, and time.     Cranial Nerves: No cranial nerve deficit.     Motor: No weakness.  Psychiatric:  Mood and Affect: Mood normal.        Behavior: Behavior normal.      No results found for any visits on 05/23/23.  Recent Results (from the past 2160 hour([place]en Draughon))  Cytology - PAP     Status: Abnormal   Collection Time: 02/25/23 11:19 AM  Result Value Ref Range   High risk HPV Negative    Adequacy      Satisfactory for evaluation; transformation zone component PRESENT.   Diagnosis - Atrophic pattern with epithelial atypia (A)    Comment Normal Reference Range HPV - Negative   Lipid panel     Status: Abnormal   Collection Time: 05/21/23  9:26 AM  Result Value Ref Range   Cholesterol, Total 215 (H) 100 - 199 mg/dL   Triglycerides 161 (H) 0 - 149 mg/dL   HDL 51 >09 mg/dL   VLDL Cholesterol Cal 53 (H) 5 - 40 mg/dL   LDL Chol Calc (NIH) 604 (H) 0 - 99 mg/dL   Chol/HDL Ratio 4.2 0.0 - 4.4 ratio    Comment:                                   T. Chol/HDL Ratio                                             Men  Women                               1/2 Avg.Risk  3.4    3.3                                   Avg.Risk  5.0    4.4                                2X Avg.Risk  9.6    7.1                                3X Avg.Risk 23.4   11.0   Hemoglobin A1c     Status: Abnormal   Collection Time: 05/21/23  9:26 AM  Result Value Ref Range   Hgb A1c MFr Bld 5.8 (H) 4.8 - 5.6 %    Comment:          Prediabetes: 5.7 - 6.4          Diabetes: >6.4          Glycemic control for adults with diabetes: <7.0    Est. average glucose Bld gHb Est-mCnc 120 mg/dL  Uric acid     Status: None   Collection Time: 05/21/23  9:29 AM  Result Value Ref Range   Uric Acid 6.4 3.0 - 7.2 mg/dL    Comment:            Therapeutic target for gout  patients: <6.0      Assessment & Plan:  As per problem list. Problem List Items Addressed This Visit       Cardiovascular and Mediastinum   Primary hypertension  Relevant Medications   Bempedoic Acid-Ezetimibe (NEXLIZET) 180-10 MG TABS   carvedilol (COREG CR) 20 MG 24 hr capsule   chlorthalidone (HYGROTON) 25 MG tablet     Other   Mixed hyperlipidemia   Relevant Medications   Bempedoic Acid-Ezetimibe (NEXLIZET) 180-10 MG TABS   carvedilol (COREG CR) 20 MG 24 hr capsule   chlorthalidone (HYGROTON) 25 MG tablet   Prediabetes - Primary   Other Visit Diagnoses     Hyperlipidemia, unspecified       Relevant Medications   Bempedoic Acid-Ezetimibe (NEXLIZET) 180-10 MG TABS   carvedilol (COREG CR) 20 MG 24 hr capsule   chlorthalidone (HYGROTON) 25 MG tablet   Essential (primary) hypertension       Relevant Medications   Bempedoic Acid-Ezetimibe (NEXLIZET) 180-10 MG TABS   carvedilol (COREG CR) 20 MG 24 hr capsule   chlorthalidone (HYGROTON) 25 MG tablet       Return in about 3 months (around 08/21/2023) for fu with labs prior.   Total time spent: 20 minutes  Luna Fuse, MD  05/23/2023   This document may have been prepared by [facility] Voice Recognition software and as such may include unintentional dictation errors.

## 2023-08-26 ENCOUNTER — Other Ambulatory Visit

## 2023-08-26 DIAGNOSIS — E782 Mixed hyperlipidemia: Secondary | ICD-10-CM

## 2023-08-26 DIAGNOSIS — R7303 Prediabetes: Secondary | ICD-10-CM

## 2023-08-27 LAB — HEMOGLOBIN A1C
Est. average glucose Bld gHb Est-mCnc: 123 mg/dL
Hgb A1c MFr Bld: 5.9 % — ABNORMAL HIGH (ref 4.8–5.6)

## 2023-08-27 LAB — LIPID PANEL
Chol/HDL Ratio: 3.6 ratio (ref 0.0–4.4)
Cholesterol, Total: 224 mg/dL — ABNORMAL HIGH (ref 100–199)
HDL: 62 mg/dL (ref >39)
LDL Chol Calc (NIH): 122 mg/dL — ABNORMAL HIGH (ref 0–99)
Triglycerides: 229 mg/dL — ABNORMAL HIGH (ref 0–149)
VLDL Cholesterol Cal: 40 mg/dL (ref 5–40)

## 2023-08-29 ENCOUNTER — Ambulatory Visit: Payer: Federal, State, Local not specified - PPO | Admitting: Internal Medicine

## 2023-08-31 ENCOUNTER — Other Ambulatory Visit: Payer: Self-pay | Admitting: Internal Medicine

## 2023-08-31 DIAGNOSIS — I10 Essential (primary) hypertension: Secondary | ICD-10-CM

## 2023-09-02 ENCOUNTER — Encounter: Payer: Self-pay | Admitting: Internal Medicine

## 2023-09-02 ENCOUNTER — Ambulatory Visit: Admitting: Internal Medicine

## 2023-09-02 DIAGNOSIS — R7303 Prediabetes: Secondary | ICD-10-CM | POA: Diagnosis not present

## 2023-09-02 DIAGNOSIS — E782 Mixed hyperlipidemia: Secondary | ICD-10-CM

## 2023-09-02 NOTE — Progress Notes (Signed)
 Established Patient Office Visit  Subjective:  Patient ID: Crystal Li, female    DOB: 1965-08-04  Age: 58 y.o. MRN: [id number]  Chief Complaint  Patient presents with   Follow-up    3 month follow up  Labs done prior    No new complaints, here for lab review and medication refills. Labs reviewed and notable for deterioration in lipids and A1c     No other concerns at this time.   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   Wears contact lenses     Past Surgical History:  Procedure Laterality Date   COLONOSCOPY WITH PROPOFOL N/A 09/14/2019   Procedure: COLONOSCOPY WITH PROPOFOL;  Surgeon: Toney Reil, MD;  Location: [facility];  Service: Endoscopy;  Laterality: N/A;  Priority 4   COLONOSCOPY WITH PROPOFOL N/A 04/10/2023   Procedure: COLONOSCOPY WITH PROPOFOL;  Surgeon: Toney Reil, MD;  Location: [facility] ENDOSCOPY;  Service: Gastroenterology;  Laterality: N/A;   CRYOTHERAPY  1985   ENDOMETRIAL ABLATION W/ NOVASURE  11/2009   ENDOMETRIAL BIOPSY  10/2009   hysteroscopy D&C  2011   POLYPECTOMY  09/14/2019   Procedure: POLYPECTOMY;  Surgeon: Toney Reil, MD;  Location: [facility];  Service: Endoscopy;;   SKIN CANCER EXCISION      Social History   Socioeconomic History   Marital status: Married    Spouse name: Not on file   Number of children: Not on file   Years of education: Not on file   Highest education level: Not on file  Occupational History   Not on file  Tobacco Use   Smoking  status: Never   Smokeless tobacco: Never  Vaping Use   Vaping status: Never Used  Substance and Sexual Activity   Alcohol use: Yes    Alcohol/week: 7.0 standard drinks of alcohol    Types: 7 Glasses of wine per week   Drug use: No   Sexual activity: Yes    Birth control/protection: Post-menopausal  Other Topics Concern   Not on file  Social History Narrative   Not on file   Social Drivers of Health   Financial Resource Strain: Not on file  Food Insecurity: Not on file  Transportation Needs: Not on file  Physical Activity: Insufficiently Active (07/10/2017)   Exercise Vital Sign    Days of Exercise per Week: 2 days    Minutes of Exercise per Session: 20 min  Stress: No Stress Concern Present (07/10/2017)   [employer] of Occupational Health - Occupational Stress Questionnaire    Feeling of Stress : Not at all  Social Connections: Moderately Integrated (07/10/2017)   Social Connection and Isolation Panel [NHANES]    Frequency of Communication with Friends and Family: More than three times a week    Frequency of Social Gatherings with Friends and Family: Twice a week    Attends Religious Services: 1 to 4 times per year    Active Member of  Clubs or Organizations: No    Attends Banker Meetings: Never    Marital Status: Married  Catering manager Violence: Not At Risk (07/10/2017)   Humiliation, Afraid, Rape, and Kick questionnaire    Fear of Current or Ex-Partner: No    Emotionally Abused: No    Physically Abused: No    Sexually Abused: No    Family History  Problem Relation Age of Onset   Ovarian cancer Mother 73       2014, insurance would not cover testing. Sister is BRCA Neg   Hypertension Mother    Hypertension Father     Allergies  Allergen Reactions   Simvastatin Anaphylaxis    Outpatient Medications Prior to Visit  Medication Sig   ALPRAZolam (XANAX) 0.5 MG tablet Take 1 tablet (0.5 mg total) by mouth 2 (two) times daily as needed for  anxiety.   Bempedoic Acid-Ezetimibe (NEXLIZET) 180-10 MG TABS Take 1 tablet by mouth daily.   carvedilol (COREG CR) 20 MG 24 hr capsule Take 1 capsule (20 mg total) by mouth daily.   chlorthalidone (HYGROTON) 25 MG tablet TAKE 1 TABLET BY MOUTH EVERY DAY IN THE MORNING   Cholecalciferol (VITAMIN D3) 50 MCG (2000 UT) TABS Take by mouth daily.   estradiol (ESTRACE VAGINAL) 0.1 MG/GM vaginal cream Insert 1 g vaginally nightly for 1 week, then once wkly as maintenance   No facility-administered medications prior to visit.    Review of Systems  Constitutional: Negative.   HENT: Negative.    Eyes: Negative.   Respiratory: Negative.    Cardiovascular: Negative.   Gastrointestinal: Negative.   Genitourinary: Negative.   Skin: Negative.   Neurological: Negative.   Endo/Heme/Allergies: Negative.        Objective:   BP (!) 147/90   Pulse 75   Temp 98.6 F (37 C)   Wt 117 lb (53.1 kg)   SpO2 99%   BMI 22.85 kg/m   Vitals:   09/02/23 1431  BP: (!) 147/90  Pulse: 75  Temp: 98.6 F (37 C)  Weight: 117 lb (53.1 kg)  SpO2: 99%    Physical Exam Vitals reviewed.  Constitutional:      General: She is not in acute distress. HENT:     Head: Normocephalic.     Nose: Nose normal.     Mouth/Throat:     Mouth: Mucous membranes are moist.  Eyes:     Extraocular Movements: Extraocular movements intact.     Pupils: Pupils are equal, round, and reactive to light.  Cardiovascular:     Rate and Rhythm: Normal rate and regular rhythm.     Heart sounds: No murmur heard. Pulmonary:     Effort: Pulmonary effort is normal.     Breath sounds: No rhonchi or rales.  Abdominal:     General: Abdomen is flat.     Palpations: There is no hepatomegaly, splenomegaly or mass.  Musculoskeletal:        General: Normal range of motion.     Cervical back: Normal range of motion. No tenderness.  Skin:    General: Skin is warm and dry.  Neurological:     General: No focal deficit present.      Mental Status: She is alert and oriented to person, place, and time.     Cranial Nerves: No cranial nerve deficit.     Motor: No weakness.  Psychiatric:        Mood and Affect: Mood normal.  Behavior: Behavior normal.      No results found for any visits on 09/02/23.  Recent Results (from the past 2160 hours)  Hemoglobin A1c     Status: Abnormal   Collection Time: 08/26/23  9:19 AM  Result Value Ref Range   Hgb A1c MFr Bld 5.9 (H) 4.8 - 5.6 %    Comment:          Prediabetes: 5.7 - 6.4          Diabetes: >6.4          Glycemic control for adults with diabetes: <7.0    Est. average glucose Bld gHb Est-mCnc 123 mg/dL  Lipid panel     Status: Abnormal   Collection Time: 08/26/23  9:19 AM  Result Value Ref Range   Cholesterol, Total 224 (H) 100 - 199 mg/dL   Triglycerides 914 (H) 0 - 149 mg/dL   HDL 62 >78 mg/dL   VLDL Cholesterol Cal 40 5 - 40 mg/dL   LDL Chol Calc (NIH) 295 (H) 0 - 99 mg/dL   Chol/HDL Ratio 3.6 0.0 - 4.4 ratio    Comment:                                   T. Chol/HDL Ratio                                             Men  Women                               1/2 Avg.Risk  3.4    3.3                                   Avg.Risk  5.0    4.4                                2X Avg.Risk  9.6    7.1                                3X Avg.Risk 23.4   11.0       Assessment & Plan:  As per problem list  Problem List Items Addressed This Visit       Other   Mixed hyperlipidemia   Prediabetes    No follow-ups on file.   Total time spent: 20 minutes  Luna Fuse, MD  09/02/2023   This document may have been prepared by [facility] Voice Recognition software and as such may include unintentional dictation errors.

## 2023-10-09 ENCOUNTER — Other Ambulatory Visit: Payer: Self-pay | Admitting: Obstetrics and Gynecology

## 2023-10-09 DIAGNOSIS — F419 Anxiety disorder, unspecified: Secondary | ICD-10-CM

## 2023-12-02 ENCOUNTER — Other Ambulatory Visit

## 2023-12-02 DIAGNOSIS — E782 Mixed hyperlipidemia: Secondary | ICD-10-CM

## 2023-12-02 DIAGNOSIS — R7303 Prediabetes: Secondary | ICD-10-CM

## 2023-12-03 LAB — LIPID PANEL
Chol/HDL Ratio: 3.9 ratio (ref 0.0–4.4)
Cholesterol, Total: 235 mg/dL — ABNORMAL HIGH (ref 100–199)
HDL: 61 mg/dL (ref >39)
LDL Chol Calc (NIH): 130 mg/dL — ABNORMAL HIGH (ref 0–99)
Triglycerides: 249 mg/dL — ABNORMAL HIGH (ref 0–149)
VLDL Cholesterol Cal: 44 mg/dL — ABNORMAL HIGH (ref 5–40)

## 2023-12-03 LAB — HEMOGLOBIN A1C
Est. average glucose Bld gHb Est-mCnc: 114 mg/dL
Hgb A1c MFr Bld: 5.6 % (ref 4.8–5.6)

## 2023-12-05 ENCOUNTER — Ambulatory Visit: Payer: Self-pay | Admitting: Internal Medicine

## 2023-12-05 ENCOUNTER — Encounter: Payer: Self-pay | Admitting: Internal Medicine

## 2023-12-05 ENCOUNTER — Ambulatory Visit: Admitting: Internal Medicine

## 2023-12-05 DIAGNOSIS — I10 Essential (primary) hypertension: Secondary | ICD-10-CM

## 2023-12-05 DIAGNOSIS — E782 Mixed hyperlipidemia: Secondary | ICD-10-CM

## 2023-12-05 DIAGNOSIS — R7303 Prediabetes: Secondary | ICD-10-CM

## 2023-12-05 MED ORDER — CARVEDILOL PHOSPHATE ER 20 MG PO CP24: 20.0000 mg | ORAL_CAPSULE | Freq: Every day | ORAL | 1 refills | Status: DC

## 2023-12-05 MED ORDER — CHLORTHALIDONE 25 MG PO TABS: ORAL_TABLET | ORAL | 0 refills | Status: DC

## 2023-12-05 NOTE — Progress Notes (Signed)
 Established Patient Office Visit  Subjective:  Patient ID: Crystal Li, female    DOB: 01-Jun-1966  Age: 58 y.o. MRN: [id number]  Chief Complaint  Patient presents with   Follow-up    3 months follow up    No new complaints, here for lab review and medication refills. Labs reviewed and notable for improvement in A1c but deterioration in lipids.     No other concerns at this time.   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;  Service: Gastroenterology;  Laterality: N/A;   CRYOTHERAPY  1985   ENDOMETRIAL ABLATION W/ NOVASURE  11/2009   ENDOMETRIAL BIOPSY  10/2009   hysteroscopy D&C  2011   POLYPECTOMY  09/14/2019   Procedure: POLYPECTOMY;  Surgeon: Selena Daily, MD;  Location: [facility];  Service: Endoscopy;;   SKIN CANCER EXCISION      Social History   Socioeconomic History   Marital status: Married    Spouse name: Not on file   Number of children: Not on file   Years of education: Not on file   Highest education level: Not on file  Occupational History   Not on file  Tobacco Use   Smoking  status: Never   Smokeless tobacco: Never  Vaping Use   Vaping status: Never Used  Substance and Sexual Activity   Alcohol use: Yes    Alcohol/week: 7.0 standard drinks of alcohol    Types: 7 Glasses of wine per week   Drug use: No   Sexual activity: Yes    Birth control/protection: Post-menopausal  Other Topics Concern   Not on file  Social History Narrative   Not on file   Social Drivers of Health   Financial Resource Strain: Not on file  Food Insecurity: Not on file  Transportation Needs: Not on file  Physical Activity: Insufficiently Active (07/10/2017)   Exercise Vital Sign    Days of Exercise per Week: 2 days    Minutes of Exercise per Session: 20 min  Stress: No Stress Concern Present (07/10/2017)   [employer] of Occupational Health - Occupational Stress Questionnaire    Feeling of Stress : Not at all  Social Connections: Moderately Integrated (07/10/2017)   Social Connection and Isolation Panel    Frequency of Communication with Friends and Family: More than three times a week    Frequency of Social Gatherings with Friends and Family: Twice a week    Attends Religious Services: 1 to 4 times per year    Active Member of [employer] or Organizations:  No    Attends Club or Organization Meetings: Never    Marital Status: Married  Catering manager Violence: Not At Risk (07/10/2017)   Humiliation, Afraid, Rape, and Kick questionnaire    Fear of Current or Ex-Partner: No    Emotionally Abused: No    Physically Abused: No    Sexually Abused: No    Family History  Problem Relation Age of Onset   Ovarian cancer Mother 70       2014, insurance would not cover testing. Sister is BRCA Neg   Hypertension Mother    Hypertension Father     Allergies  Allergen Reactions   Simvastatin Anaphylaxis    Outpatient Medications Prior to Visit  Medication Sig   ALPRAZolam  (XANAX ) 0.5 MG tablet TAKE 1 TABLET BY MOUTH 2 TIMES DAILY AS NEEDED FOR ANXIETY.   Bempedoic  Acid-Ezetimibe (NEXLIZET ) 180-10 MG TABS Take 1 tablet by mouth daily.   carvedilol  (COREG  CR) 20 MG 24 hr capsule Take 1 capsule (20 mg total) by mouth daily.   chlorthalidone  (HYGROTON ) 25 MG tablet TAKE 1 TABLET BY MOUTH EVERY DAY IN THE MORNING   Cholecalciferol (VITAMIN D3) 50 MCG (2000 UT) TABS Take by mouth daily.   estradiol  (ESTRACE  VAGINAL) 0.1 MG/GM vaginal cream Insert 1 g vaginally nightly for 1 week, then once wkly as maintenance (Patient not taking: Reported on 12/05/2023)   No facility-administered medications prior to visit.    Review of Systems  Constitutional: Negative.   HENT: Negative.    Eyes: Negative.   Respiratory: Negative.    Cardiovascular: Negative.   Gastrointestinal: Negative.   Genitourinary: Negative.   Skin: Negative.   Neurological: Negative.   Endo/Heme/Allergies: Negative.        Objective:   BP (!) 128/98 (Cuff Size: Normal)   Pulse 76   Temp 98.4 F (36.9 C) (Tympanic)   Ht 5' (1.524 m)   Wt 117 lb (53.1 kg)   SpO2 97%   BMI 22.85 kg/m   Vitals:   12/05/23 1047 12/05/23 1125  BP: (!) 154/102 (!) 128/98  Pulse: 76   Temp: 98.4 F (36.9 C)   Height: 5' (1.524 m)   Weight: 117 lb (53.1 kg)   SpO2: 97%   TempSrc: Tympanic   BMI (Calculated): 22.85     Physical Exam Vitals reviewed.  Constitutional:      General: She is not in acute distress. HENT:     Head: Normocephalic.     Nose: Nose normal.     Mouth/Throat:     Mouth: Mucous membranes are moist.   Eyes:     Extraocular Movements: Extraocular movements intact.     Pupils: Pupils are equal, round, and reactive to light.    Cardiovascular:     Rate and Rhythm: Normal rate and regular rhythm.     Heart sounds: No murmur heard. Pulmonary:     Effort: Pulmonary effort is normal.     Breath sounds: No rhonchi or rales.  Abdominal:     General: Abdomen is flat.     Palpations: There is no hepatomegaly, splenomegaly or mass.   Musculoskeletal:        General:  Normal range of motion.     Cervical back: Normal range of motion. No tenderness.   Skin:    General: Skin is warm and dry.   Neurological:     General: No focal deficit present.     Mental Status: She is alert and oriented to person, place,  and time.     Cranial Nerves: No cranial nerve deficit.     Motor: No weakness.   Psychiatric:        Mood and Affect: Mood normal.        Behavior: Behavior normal.      No results found for any visits on 12/05/23.  Recent Results (from the past 2160 hours)  Hemoglobin A1c     Status: None   Collection Time: 12/02/23  9:08 AM  Result Value Ref Range   Hgb A1c MFr Bld 5.6 4.8 - 5.6 %    Comment:          Prediabetes: 5.7 - 6.4          Diabetes: >6.4          Glycemic control for adults with diabetes: <7.0    Est. average glucose Bld gHb Est-mCnc 114 mg/dL  Lipid panel     Status: Abnormal   Collection Time: 12/02/23  9:08 AM  Result Value Ref Range   Cholesterol, Total 235 (H) 100 - 199 mg/dL   Triglycerides 102 (H) 0 - 149 mg/dL   HDL 61 >72 mg/dL   VLDL Cholesterol Cal 44 (H) 5 - 40 mg/dL   LDL Chol Calc (NIH) 536 (H) 0 - 99 mg/dL   Chol/HDL Ratio 3.9 0.0 - 4.4 ratio    Comment:                                   T. Chol/HDL Ratio                                             Men  Women                               1/2 Avg.Risk  3.4    3.3                                   Avg.Risk  5.0    4.4                                2X Avg.Risk  9.6    7.1                                3X Avg.Risk 23.4   11.0       Assessment & Plan:  Stricter low calorie diet, low cholesterol and low fat diet and exercise as much as possible. Problem List Items Addressed This Visit       Cardiovascular and Mediastinum   Essential (primary) hypertension   Other Visit Diagnoses       Primary hypertension           Return in about 3 months (around 03/06/2024).   Total time spent: 20 minutes  Arzella Bitters,  MD  12/05/2023   This document may have been prepared by [facility] Voice Recognition software and as such may include unintentional dictation errors.

## 2023-12-24 ENCOUNTER — Other Ambulatory Visit: Payer: Self-pay | Admitting: Obstetrics and Gynecology

## 2023-12-24 DIAGNOSIS — F419 Anxiety disorder, unspecified: Secondary | ICD-10-CM

## 2024-02-13 ENCOUNTER — Other Ambulatory Visit: Payer: Self-pay | Admitting: Internal Medicine

## 2024-02-13 DIAGNOSIS — E782 Mixed hyperlipidemia: Secondary | ICD-10-CM

## 2024-02-20 ENCOUNTER — Other Ambulatory Visit: Payer: Self-pay | Admitting: Obstetrics and Gynecology

## 2024-02-20 DIAGNOSIS — Z1231 Encounter for screening mammogram for malignant neoplasm of breast: Secondary | ICD-10-CM

## 2024-03-04 ENCOUNTER — Other Ambulatory Visit

## 2024-03-05 ENCOUNTER — Ambulatory Visit: Admitting: Internal Medicine

## 2024-03-05 LAB — LIPID PANEL
Chol/HDL Ratio: 3.6 ratio (ref 0.0–4.4)
Cholesterol, Total: 225 mg/dL — ABNORMAL HIGH (ref 100–199)
HDL: 62 mg/dL (ref >39)
LDL Chol Calc (NIH): 141 mg/dL — ABNORMAL HIGH (ref 0–99)
Triglycerides: 123 mg/dL (ref 0–149)
VLDL Cholesterol Cal: 22 mg/dL (ref 5–40)

## 2024-03-05 LAB — HEMOGLOBIN A1C
Est. average glucose Bld gHb Est-mCnc: 117 mg/dL
Hgb A1c MFr Bld: 5.7 % — ABNORMAL HIGH (ref 4.8–5.6)

## 2024-03-09 ENCOUNTER — Ambulatory Visit: Admitting: Internal Medicine

## 2024-03-09 ENCOUNTER — Encounter: Payer: Self-pay | Admitting: Internal Medicine

## 2024-03-09 DIAGNOSIS — R7303 Prediabetes: Secondary | ICD-10-CM

## 2024-03-09 DIAGNOSIS — I10 Essential (primary) hypertension: Secondary | ICD-10-CM | POA: Diagnosis not present

## 2024-03-09 MED ORDER — CHLORTHALIDONE 25 MG PO TABS: ORAL_TABLET | ORAL | 0 refills | Status: DC

## 2024-03-09 NOTE — Progress Notes (Signed)
 Established Patient Office Visit  Subjective:  Patient ID: Crystal Li, female    DOB: Aug 24, 1965  Age: 58 y.o. MRN: [id number]  Chief Complaint  Patient presents with   Follow-up    3 month follow up lab results    No new complaints, here for lab review and medication refills. Labs reviewed and notable for elevated LDL, TC and A1c in the prediabetic range. BP elevated on recheck.      No other concerns at this time.   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   Wears contact lenses     Past Surgical History:  Procedure Laterality Date   COLONOSCOPY WITH PROPOFOL  N/A 09/14/2019   Procedure: COLONOSCOPY WITH PROPOFOL ;  Surgeon: Unk Corinn Skiff, MD;  Location: [facility];  Service: Endoscopy;  Laterality: N/A;  Priority 4   COLONOSCOPY WITH PROPOFOL  N/A 04/10/2023   Procedure: COLONOSCOPY WITH PROPOFOL ;  Surgeon: Unk Corinn Skiff, MD;  Location: [facility] ENDOSCOPY;  Service: Gastroenterology;  Laterality: N/A;   CRYOTHERAPY  1985   ENDOMETRIAL ABLATION W/ NOVASURE  11/2009   ENDOMETRIAL BIOPSY  10/2009   hysteroscopy D&C  2011   POLYPECTOMY  09/14/2019   Procedure: POLYPECTOMY;  Surgeon: Unk Corinn Skiff, MD;  Location: [facility];  Service: Endoscopy;;   SKIN CANCER EXCISION      Social History   Socioeconomic History   Marital status: Married    Spouse name: Not on file   Number of children: Not on file   Years of education: Not on file   Highest education level: Not on file  Occupational History    Not on file  Tobacco Use   Smoking status: Never   Smokeless tobacco: Never  Vaping Use   Vaping status: Never Used  Substance and Sexual Activity   Alcohol use: Yes    Alcohol/week: 7.0 standard drinks of alcohol    Types: 7 Glasses of wine per week   Drug use: No   Sexual activity: Yes    Birth control/protection: Post-menopausal  Other Topics Concern   Not on file  Social History Narrative   Not on file   Social Drivers of Health   Financial Resource Strain: Not on file  Food Insecurity: Not on file  Transportation Needs: Not on file  Physical Activity: Insufficiently Active (07/10/2017)   Exercise Vital Sign    Days of Exercise per Week: 2 days    Minutes of Exercise per Session: 20 min  Stress: No Stress Concern Present (07/10/2017)   [employer] of Occupational Health - Occupational Stress Questionnaire    Feeling of Stress : Not at all  Social Connections: Moderately Integrated (07/10/2017)   Social Connection and Isolation Panel    Frequency of Communication with Friends and Family: More than three times a week    Frequency of Social Gatherings with Friends and Family: Twice a week    Attends Religious Services: 1 to 4 times per year  Active Member of Clubs or Organizations: No    Attends Banker Meetings: Never    Marital Status: Married  Catering manager Violence: Not At Risk (07/10/2017)   Humiliation, Afraid, Rape, and Kick questionnaire    Fear of Current or Ex-Partner: No    Emotionally Abused: No    Physically Abused: No    Sexually Abused: No    Family History  Problem Relation Age of Onset   Ovarian cancer Mother 75       2014, insurance would not cover testing. Sister is BRCA Neg   Hypertension Mother    Hypertension Father     Allergies  Allergen Reactions   Simvastatin Anaphylaxis    Outpatient Medications Prior to Visit  Medication Sig   ALPRAZolam  (XANAX ) 0.5 MG tablet TAKE 1 TABLET BY MOUTH TWICE A DAY AS NEEDED  FOR ANXIETY (Patient taking differently: Take 0.5 mg by mouth as needed.)   carvedilol  (COREG  CR) 20 MG 24 hr capsule Take 1 capsule (20 mg total) by mouth daily.   Cholecalciferol (VITAMIN D3) 50 MCG (2000 UT) TABS Take by mouth daily.   estradiol  (ESTRACE ) 0.1 MG/GM vaginal cream INSERT 1 GRAM VAGINALLY NIGHTLY FOR 1 WEEK, THEN ONCE WKLY AS MAINTENANCE   NEXLIZET  180-10 MG TABS TAKE 1 TABLET BY MOUTH EVERY DAY   [DISCONTINUED] chlorthalidone  (HYGROTON ) 25 MG tablet TAKE 1 TABLET BY MOUTH EVERY DAY IN THE MORNING   No facility-administered medications prior to visit.    Review of Systems  Constitutional:  Positive for weight loss (5 lbs).  HENT: Negative.    Eyes: Negative.   Respiratory: Negative.    Cardiovascular: Negative.   Gastrointestinal: Negative.   Genitourinary: Negative.   Skin: Negative.   Neurological: Negative.   Endo/Heme/Allergies: Negative.        Objective:   BP (!) 150/98 (Cuff Size: Normal)   Pulse 74   Temp 97.6 F (36.4 C)   Ht 5' (1.524 m)   Wt 112 lb 6.4 oz (51 kg)   SpO2 95%   BMI 21.95 kg/m   Vitals:   03/09/24 1512 03/09/24 1544  BP: (!) 150/92 (!) 150/98  Pulse: 74   Temp: 97.6 F (36.4 C)   Height: 5' (1.524 m)   Weight: 112 lb 6.4 oz (51 kg)   SpO2: 95%   BMI (Calculated): 21.95     Physical Exam Vitals reviewed.  Constitutional:      General: She is not in acute distress. HENT:     Head: Normocephalic.     Nose: Nose normal.     Mouth/Throat:     Mouth: Mucous membranes are moist.  Eyes:     Extraocular Movements: Extraocular movements intact.     Pupils: Pupils are equal, round, and reactive to light.  Cardiovascular:     Rate and Rhythm: Normal rate and regular rhythm.     Heart sounds: No murmur heard. Pulmonary:     Effort: Pulmonary effort is normal.     Breath sounds: No rhonchi or rales.  Abdominal:     General: Abdomen is flat.     Palpations: There is no hepatomegaly, splenomegaly or mass.   Musculoskeletal:        General: Normal range of motion.     Cervical back: Normal range of motion. No tenderness.  Skin:    General: Skin is warm and dry.  Neurological:     General: No focal deficit present.     Mental Status: She  is alert and oriented to person, place, and time.     Cranial Nerves: No cranial nerve deficit.     Motor: No weakness.  Psychiatric:        Mood and Affect: Mood normal.        Behavior: Behavior normal.      No results found for any visits on 03/09/24.  Recent Results (from the past 2160 hours)  Hemoglobin A1c     Status: Abnormal   Collection Time: 03/04/24  9:06 AM  Result Value Ref Range   Hgb A1c MFr Bld 5.7 (H) 4.8 - 5.6 %    Comment:          Prediabetes: 5.7 - 6.4          Diabetes: >6.4          Glycemic control for adults with diabetes: <7.0    Est. average glucose Bld gHb Est-mCnc 117 mg/dL  Lipid panel     Status: Abnormal   Collection Time: 03/04/24  9:07 AM  Result Value Ref Range   Cholesterol, Total 225 (H) 100 - 199 mg/dL   Triglycerides 876 0 - 149 mg/dL   HDL 62 >60 mg/dL   VLDL Cholesterol Cal 22 5 - 40 mg/dL   LDL Chol Calc (NIH) 858 (H) 0 - 99 mg/dL   Chol/HDL Ratio 3.6 0.0 - 4.4 ratio    Comment:                                   T. Chol/HDL Ratio                                             Men  Women                               1/2 Avg.Risk  3.4    3.3                                   Avg.Risk  5.0    4.4                                2X Avg.Risk  9.6    7.1                                3X Avg.Risk 23.4   11.0       Assessment & Plan:  Crystal Li was seen today for follow-up.  Essential (primary) hypertension -     Chlorthalidone ; TAKE 1 TABLET BY MOUTH EVERY DAY IN THE MORNING  Dispense: 90 tablet; Refill: 0   Given low salt diet instructions. Problem List Items Addressed This Visit       Cardiovascular and Mediastinum   Essential (primary) hypertension   Relevant Medications   chlorthalidone   (HYGROTON ) 25 MG tablet    Return in about 3 weeks (around 03/30/2024) for BP followup.   Total time spent: 20 minutes  Sherrill Cinderella Perry, MD  03/09/2024   This document may have been prepared by [employer] Voice Recognition  software and as such may include unintentional dictation errors.

## 2024-03-12 ENCOUNTER — Ambulatory Visit: Payer: Self-pay | Admitting: Internal Medicine

## 2024-03-18 ENCOUNTER — Ambulatory Visit: Admitting: Obstetrics and Gynecology

## 2024-03-22 ENCOUNTER — Ambulatory Visit
Admission: RE | Admit: 2024-03-22 | Discharge: 2024-03-22 | Disposition: A | Discharge status: Home / Self Care | Source: Ambulatory Visit | Attending: Obstetrics and Gynecology | Admitting: Obstetrics and Gynecology

## 2024-03-22 DIAGNOSIS — Z1231 Encounter for screening mammogram for malignant neoplasm of breast: Secondary | ICD-10-CM | POA: Diagnosis present

## 2024-03-25 ENCOUNTER — Ambulatory Visit: Payer: Self-pay | Admitting: Obstetrics and Gynecology

## 2024-03-29 ENCOUNTER — Ambulatory Visit: Admitting: Internal Medicine

## 2024-03-29 NOTE — Progress Notes (Unsigned)
 PCP: Albina GORMAN Dine, MD   No chief complaint on file.   HPI:      [name] is a 58 y.o. [phone] who LMP was No LMP recorded. Patient is postmenopausal., presents today for her annual examination.  Her menses are absent due to menopause. She does not have PMB. She does not have vasomotor sx.  Sex activity: infrequently; single partner, contraception - post menopausal status. She does have vaginal dryness and uses lubricants/ no pain/bleeding.   Last Pap: 02/25/23 Results were endothelial cells with atypia/neg HPV DNA. Plan was to treat with vag ERT for 6 months and then repeat pap. 02/21/22 Results were: ASCUS /neg HPV DNA Hx of STDs: none  Last mammogram: 03/25/24 Results were: normal--routine follow-up in 12 months There is no FH of breast cancer. There is a FH of ovarian cancer in her mom. Her sister is BRCA neg. Pt is MyRisk neg 4/22; IBIS=6.9%/riskscore=4.7%. The patient does do self-breast exams.    Colonoscopy: 10/24 repeat due afer ??? Yrs; and 08/2019 with Dr. Unk with polyp; repeat due after 3 yrs.  Pt did FOBT with PCP 3/24.   Tobacco use: The patient denies current or previous tobacco use. Alcohol use: occas No drug use Exercise: moderately active  She does get adequate calcium and Vitamin D in her diet.  Labs with PCP.  Pt needs Rx RF xanax  to take sparingly for occas anxiety.    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   Wears contact lenses     Past Surgical History:  Procedure Laterality Date    COLONOSCOPY WITH PROPOFOL  N/A 09/14/2019   Procedure: COLONOSCOPY WITH PROPOFOL ;  Surgeon: Unk Corinn Skiff, MD;  Location: [facility];  Service: Endoscopy;  Laterality: N/A;  Priority 4   COLONOSCOPY WITH PROPOFOL  N/A 04/10/2023   Procedure: COLONOSCOPY WITH PROPOFOL ;  Surgeon: Unk Corinn Skiff, MD;  Location: [facility] END[facility]COPY;  Service: Gastroenterology;  Laterality: N/A;   CRYOTHERAPY  1985   ENDOMETRIAL ABLATION W/ NOVASURE  11/2009   ENDOMETRIAL BIOPSY  10/2009   hysteroscopy D&C  2011   POLYPECTOMY  09/14/2019   Procedure: POLYPECTOMY;  Surgeon: Unk Corinn Skiff, MD;  Location: [facility];  Service: Endoscopy;;   SKIN CANCER EXCISION      Family History  Problem Relation Age of Onset   Ovarian cancer Mother 25       2014, insurance would not cover testing. Sister is BRCA Neg   Hypertension Mother    Hypertension Father    Breast cancer Neg Hx     Social History   Socioeconomic History   Marital status: Married    Spouse name: Not on file   Number of children: Not on file   Years of education: Not on file   Highest education level: Not on file  Occupational History   Not on file  Tobacco Use   Smoking status: Never   Smokeless tobacco: Never  Vaping Use   Vaping status: Never Used  Substance and Sexual Activity   Alcohol use: Yes    Alcohol/week: 7.0 standard drinks of alcohol    Types: 7 Glasses of wine per week   Drug use: No   Sexual activity: Yes    Birth control/protection: Post-menopausal  Other Topics Concern   Not on file  Social History Narrative   Not on file   Social Drivers of Health   Financial Resource Strain: Not on file  Food Insecurity: Not on file  Transportation Needs: Not on file  Physical Activity: Insufficiently Active (07/10/2017)   Exercise Vital Sign    Days of Exercise per Week: 2 days    Minutes of Exercise per Session: 20 min  Stress: No Stress Concern Present (07/10/2017)   [employer] of  Occupational Health - Occupational Stress Questionnaire    Feeling of Stress : Not at all  Social Connections: Moderately Integrated (07/10/2017)   Social Connection and Isolation Panel    Frequency of Communication with Friends and Family: More than three times a week    Frequency of Social Gatherings with Friends and Family: Twice a week    Attends Religious Services: 1 to 4 times per year    Active Member of [employer] or Organizations: No    Attends Banker Meetings: Never    Marital Status: Married  Catering manager Violence: Not At Risk (07/10/2017)   Humiliation, Afraid, Rape, and Kick questionnaire    Fear of Current or Ex-Partner: No    Emotionally Abused: No    Physically Abused: No    Sexually Abused: No    No outpatient medications have been marked as taking for the 03/30/24 encounter (Appointment) with Jamesrobert Ohanesian B, PA-C.      ROS:  Review of Systems  Constitutional:  Negative for fatigue, fever and unexpected weight change.  Respiratory:  Negative for cough, shortness of breath and wheezing.   Cardiovascular:  Negative for chest pain, palpitations and leg swelling.  Gastrointestinal:  Negative for blood in stool, constipation, diarrhea, nausea and vomiting.  Endocrine: Negative for cold intolerance, heat intolerance and polyuria.  Genitourinary:  Negative for dyspareunia, dysuria, flank pain, frequency, genital sores, hematuria, menstrual problem, pelvic pain, urgency, vaginal bleeding, vaginal discharge and vaginal pain.  Musculoskeletal:  Negative for back pain, joint swelling and myalgias.  Skin:  Negative for rash.  Neurological:  Negative for dizziness, syncope, light-headedness, numbness and headaches.  Hematological:  Negative for adenopathy.  Psychiatric/Behavioral:  Negative for agitation, confusion, sleep disturbance and suicidal ideas. The patient is not nervous/anxious.      Objective: There were no vitals taken for this  visit.   Physical Exam Constitutional:      Appearance: She is well-developed.  Genitourinary:     Vulva and rectum normal.     Right Labia: No rash, tenderness or lesions.    Left Labia: No tenderness, lesions or rash.    No vaginal discharge, erythema or tenderness.      Right Adnexa: not tender and no mass present.    Left Adnexa: not tender and no mass present.    No cervical motion tenderness, friability or polyp.     Uterus is not enlarged or tender.  Rectum:     Guaiac result negative.     No anal fissure or tenderness.  Breasts:    Right: No mass, nipple discharge, skin change or tenderness.     Left: No mass, nipple discharge, skin change  or tenderness.  Neck:     Thyroid: No thyromegaly.  Cardiovascular:     Rate and Rhythm: Normal rate and regular rhythm.     Heart sounds: Normal heart sounds. No murmur heard. Pulmonary:     Effort: Pulmonary effort is normal.     Breath sounds: Normal breath sounds.  Abdominal:     Palpations: Abdomen is soft.     Tenderness: There is no abdominal tenderness. There is no guarding or rebound.  Musculoskeletal:        General: Normal range of motion.     Cervical back: Normal range of motion.  Lymphadenopathy:     Cervical: No cervical adenopathy.  Neurological:     General: No focal deficit present.     Mental Status: She is alert and oriented to person, place, and time.     Cranial Nerves: No cranial nerve deficit.  Skin:    General: Skin is warm and dry.  Psychiatric:        Mood and Affect: Mood normal.        Behavior: Behavior normal.        Thought Content: Thought content normal.        Judgment: Judgment normal.  Vitals reviewed.     Assessment/Plan: Encounter for annual routine gynecological examination  Cervical cancer screening - Plan: Cytology - PAP  ASCUS of cervix with negative high risk HPV - Plan: Cytology - PAP; repeat pap today.   Encounter for screening mammogram for malignant neoplasm of  breast - Plan: MM 3D SCREENING MAMMOGRAM BILATERAL BREAST; pt to schedule mammo  Screening for colon cancer - Plan: Ambulatory referral to Gastroenterology; refer to GI  Family history of ovarian cancer--pt is MyRisk neg; no further screening recommendations  Anxiety - Plan: ALPRAZolam  (XANAX ) 0.5 MG tablet; Rx RF eRxd.    No orders of the defined types were placed in this encounter.           GYN counsel breast self exam, mammography screening, menopause, adequate intake of calcium and vitamin D, diet and exercise    F/U  No follow-ups on file.  Wilburta Milbourn B. Juwana Thoreson, PA-C 03/29/2024 1:14 PM

## 2024-03-30 ENCOUNTER — Encounter: Payer: Self-pay | Admitting: Obstetrics and Gynecology

## 2024-03-30 ENCOUNTER — Ambulatory Visit: Admitting: Internal Medicine

## 2024-03-30 ENCOUNTER — Other Ambulatory Visit (HOSPITAL_COMMUNITY)
Admission: RE | Admit: 2024-03-30 | Discharge: 2024-03-30 | Disposition: A | Source: Ambulatory Visit | Attending: Obstetrics and Gynecology | Admitting: Obstetrics and Gynecology

## 2024-03-30 ENCOUNTER — Ambulatory Visit (INDEPENDENT_AMBULATORY_CARE_PROVIDER_SITE_OTHER): Admitting: Obstetrics and Gynecology

## 2024-03-30 VITALS — BP 156/94 | HR 80 | Temp 97.8°F | Ht 60.0 in | Wt 112.4 lb

## 2024-03-30 VITALS — BP 164/101 | HR 81 | Ht 60.0 in | Wt 112.0 lb

## 2024-03-30 DIAGNOSIS — R87618 Other abnormal cytological findings on specimens from cervix uteri: Secondary | ICD-10-CM | POA: Diagnosis not present

## 2024-03-30 DIAGNOSIS — Z1151 Encounter for screening for human papillomavirus (HPV): Secondary | ICD-10-CM

## 2024-03-30 DIAGNOSIS — I10 Essential (primary) hypertension: Secondary | ICD-10-CM

## 2024-03-30 DIAGNOSIS — Z01411 Encounter for gynecological examination (general) (routine) with abnormal findings: Secondary | ICD-10-CM | POA: Diagnosis not present

## 2024-03-30 DIAGNOSIS — Z124 Encounter for screening for malignant neoplasm of cervix: Secondary | ICD-10-CM | POA: Insufficient documentation

## 2024-03-30 DIAGNOSIS — Z1211 Encounter for screening for malignant neoplasm of colon: Secondary | ICD-10-CM

## 2024-03-30 DIAGNOSIS — F419 Anxiety disorder, unspecified: Secondary | ICD-10-CM

## 2024-03-30 DIAGNOSIS — Z01419 Encounter for gynecological examination (general) (routine) without abnormal findings: Secondary | ICD-10-CM

## 2024-03-30 DIAGNOSIS — N952 Postmenopausal atrophic vaginitis: Secondary | ICD-10-CM

## 2024-03-30 DIAGNOSIS — Z1231 Encounter for screening mammogram for malignant neoplasm of breast: Secondary | ICD-10-CM

## 2024-03-30 DIAGNOSIS — Z8041 Family history of malignant neoplasm of ovary: Secondary | ICD-10-CM

## 2024-03-30 MED ORDER — ALPRAZOLAM 0.5 MG PO TABS: 0.5000 mg | ORAL_TABLET | Freq: Two times a day (BID) | ORAL | 1 refills | Status: AC | PRN

## 2024-03-30 MED ORDER — CARVEDILOL PHOSPHATE ER 40 MG PO CP24: 40.0000 mg | ORAL_CAPSULE | Freq: Every day | ORAL | 0 refills | Status: DC

## 2024-03-30 MED ORDER — ESTRADIOL 10 MCG VA TABS: 10.0000 ug | ORAL_TABLET | VAGINAL | 3 refills | Status: AC

## 2024-03-30 NOTE — Progress Notes (Signed)
 Established Patient Office Visit  Subjective:  Patient ID: Crystal Li, female    DOB: 1965-10-29  Age: 58 y.o. MRN: [id number]  Chief Complaint  Patient presents with   Follow-up    3 week bp follow up    No new complaints, here for BP f/u and medication refills. BP still elevated on review of her home readings and today'Crystal Li reading.    No other concerns at this time.   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   Wears contact lenses     Past Surgical History:  Procedure Laterality Date   COLONOSCOPY WITH PROPOFOL  N/A 09/14/2019   Procedure: COLONOSCOPY WITH PROPOFOL ;  Surgeon: Unk Corinn Skiff, MD;  Location: [facility];  Service: Endoscopy;  Laterality: N/A;  Priority 4   COLONOSCOPY WITH PROPOFOL  N/A 04/10/2023   Procedure: COLONOSCOPY WITH PROPOFOL ;  Surgeon: Unk Corinn Skiff, MD;  Location: [facility] ENDOSCOPY;  Service: Gastroenterology;  Laterality: N/A;   CRYOTHERAPY  1985   ENDOMETRIAL ABLATION W/ NOVASURE  11/2009   ENDOMETRIAL BIOPSY  10/2009   hysteroscopy D&C  2011   POLYPECTOMY  09/14/2019   Procedure: POLYPECTOMY;  Surgeon: Unk Corinn Skiff, MD;  Location: [facility];  Service: Endoscopy;;   SKIN CANCER EXCISION      Social History   Socioeconomic History   Marital status: Married    Spouse name: Not on file   Number of children: Not on file   Years of education: Not on file   Highest education level: Not on file  Occupational History   Not on file  Tobacco Use   Smoking status: Never    Smokeless tobacco: Never  Vaping Use   Vaping status: Never Used  Substance and Sexual Activity   Alcohol use: Yes    Alcohol/week: 7.0 standard drinks of alcohol    Types: 7 Glasses of wine per week   Drug use: No   Sexual activity: Yes    Birth control/protection: Post-menopausal  Other Topics Concern   Not on file  Social History Narrative   Not on file   Social Drivers of Health   Financial Resource Strain: Not on file  Food Insecurity: Not on file  Transportation Needs: Not on file  Physical Activity: Insufficiently Active (07/10/2017)   Exercise Vital Sign    Days of Exercise per Week: 2 days    Minutes of Exercise per Session: 20 min  Stress: No Stress Concern Present (07/10/2017)   [employer] of Occupational Health - Occupational Stress Questionnaire    Feeling of Stress : Not at all  Social Connections: Moderately Integrated (07/10/2017)   Social Connection and Isolation Panel    Frequency of Communication with Friends and Family: More than three times a week    Frequency of Social Gatherings with Friends and Family: Twice a week    Attends Religious Services: 1 to 4 times per year    Active Member of [employer] or Organizations:  No    Attends Club or Organization Meetings: Never    Marital Status: Married  Catering manager Violence: Not At Risk (07/10/2017)   Humiliation, Afraid, Rape, and Kick questionnaire    Fear of Current or Ex-Partner: No    Emotionally Abused: No    Physically Abused: No    Sexually Abused: No    Family History  Problem Relation Age of Onset   Ovarian cancer Mother 36   Hypertension Mother    Hypertension Father    Breast cancer Neg Hx     Allergies  Allergen Reactions   Simvastatin Anaphylaxis    Outpatient Medications Prior to Visit  Medication Sig   ALPRAZolam  (XANAX ) 0.5 MG tablet Take 1 tablet (0.5 mg total) by mouth 2 (two) times daily as needed for anxiety.   chlorthalidone  (HYGROTON ) 25 MG tablet TAKE 1 TABLET BY  MOUTH EVERY DAY IN THE MORNING   Cholecalciferol (VITAMIN D3) 50 MCG (2000 UT) TABS Take by mouth daily.   Estradiol  10 MCG TABS vaginal tablet Place 1 tablet (10 mcg total) vaginally once a week.   NEXLIZET  180-10 MG TABS TAKE 1 TABLET BY MOUTH EVERY DAY   [DISCONTINUED] carvedilol  (COREG  CR) 20 MG 24 hr capsule Take 1 capsule (20 mg total) by mouth daily.   No facility-administered medications prior to visit.    Review of Systems  Constitutional:  Positive for weight loss (5 lbs).  HENT: Negative.    Eyes: Negative.   Respiratory: Negative.    Cardiovascular: Negative.   Gastrointestinal: Negative.   Genitourinary: Negative.   Skin: Negative.   Neurological: Negative.   Endo/Heme/Allergies: Negative.        Objective:   BP (!) 156/94   Pulse 80   Temp 97.8 F (36.6 C)   Ht 5' (1.524 m)   Wt 112 lb 6.4 oz (51 kg)   SpO2 99%   BMI 21.95 kg/m   Vitals:   03/30/24 1538  BP: (!) 156/94  Pulse: 80  Temp: 97.8 F (36.6 C)  Height: 5' (1.524 m)  Weight: 112 lb 6.4 oz (51 kg)  SpO2: 99%  BMI (Calculated): 21.95    Physical Exam Vitals reviewed.  Constitutional:      General: She is not in acute distress. HENT:     Head: Normocephalic.     Nose: Nose normal.     Mouth/Throat:     Mouth: Mucous membranes are moist.  Eyes:     Extraocular Movements: Extraocular movements intact.     Pupils: Pupils are equal, round, and reactive to light.  Cardiovascular:     Rate and Rhythm: Normal rate and regular rhythm.     Heart sounds: No murmur heard. Pulmonary:     Effort: Pulmonary effort is normal.     Breath sounds: No rhonchi or rales.  Abdominal:     General: Abdomen is flat.     Palpations: There is no hepatomegaly, splenomegaly or mass.  Musculoskeletal:        General: Normal range of motion.     Cervical back: Normal range of motion. No tenderness.  Skin:    General: Skin is warm and dry.  Neurological:     General: No focal deficit present.     Mental  Status: She is alert and oriented to person, place, and time.     Cranial Nerves: No cranial nerve deficit.     Motor: No weakness.  Psychiatric:        Mood and  Affect: Mood normal.        Behavior: Behavior normal.      No results found for any visits on 03/30/24.  Recent Results (from the past 2160 hours)  Hemoglobin A1c     Status: Abnormal   Collection Time: 03/04/24  9:06 AM  Result Value Ref Range   Hgb A1c MFr Bld 5.7 (H) 4.8 - 5.6 %    Comment:          Prediabetes: 5.7 - 6.4          Diabetes: >6.4          Glycemic control for adults with diabetes: <7.0    Est. average glucose Bld gHb Est-mCnc 117 mg/dL  Lipid panel     Status: Abnormal   Collection Time: 03/04/24  9:07 AM  Result Value Ref Range   Cholesterol, Total 225 (H) 100 - 199 mg/dL   Triglycerides 876 0 - 149 mg/dL   HDL 62 >60 mg/dL   VLDL Cholesterol Cal 22 5 - 40 mg/dL   LDL Chol Calc (NIH) 858 (H) 0 - 99 mg/dL   Chol/HDL Ratio 3.6 0.0 - 4.4 ratio    Comment:                                   T. Chol/HDL Ratio                                             Men  Women                               1/2 Avg.Risk  3.4    3.3                                   Avg.Risk  5.0    4.4                                2X Avg.Risk  9.6    7.1                                3X Avg.Risk 23.4   11.0       Assessment & Plan:  Hena was seen today for follow-up.  Primary hypertension -     Carvedilol  Phosphate ER; Take 1 capsule (40 mg total) by mouth daily.  Dispense: 90 capsule; Refill: 0   Increase Coreg  and continue low salt diet. Instructed to provide BP log after 2-3 wks. Problem List Items Addressed This Visit   None Visit Diagnoses       Primary hypertension    -  Primary   Relevant Medications   carvedilol  (COREG  CR) 40 MG 24 hr capsule       Return if symptoms worsen or fail to improve.   Total time spent: 20 minutes  Sherrill Cinderella Perry, MD  03/30/2024   This document may have been  prepared by [facility] Voice Recognition software and as such may include unintentional dictation errors.

## 2024-03-30 NOTE — Patient Instructions (Signed)
 I value your feedback and you entrusting [place] with your care. If you get a [place] patient survey, I would appreciate you taking the time to let us know about your experience today. Thank you! ? ? ?

## 2024-04-02 LAB — CYTOLOGY - PAP
Comment: NEGATIVE
Diagnosis: NEGATIVE
High risk HPV: NEGATIVE

## 2024-04-05 ENCOUNTER — Ambulatory Visit: Payer: Self-pay | Admitting: Obstetrics and Gynecology

## 2024-04-20 ENCOUNTER — Encounter: Payer: Self-pay | Admitting: Internal Medicine

## 2024-04-23 ENCOUNTER — Other Ambulatory Visit: Payer: Self-pay | Admitting: Internal Medicine

## 2024-04-23 DIAGNOSIS — I10 Essential (primary) hypertension: Secondary | ICD-10-CM

## 2024-04-23 MED ORDER — OLMESARTAN MEDOXOMIL 20 MG PO TABS: 20.0000 mg | ORAL_TABLET | Freq: Every day | ORAL | 2 refills | Status: DC

## 2024-06-09 ENCOUNTER — Other Ambulatory Visit: Payer: Self-pay

## 2024-06-09 ENCOUNTER — Other Ambulatory Visit

## 2024-06-09 DIAGNOSIS — E782 Mixed hyperlipidemia: Secondary | ICD-10-CM

## 2024-06-09 DIAGNOSIS — R7303 Prediabetes: Secondary | ICD-10-CM

## 2024-06-10 LAB — LIPID PANEL
Chol/HDL Ratio: 2.5 ratio (ref 0.0–4.4)
Cholesterol, Total: 146 mg/dL (ref 100–199)
HDL: 59 mg/dL
LDL Chol Calc (NIH): 73 mg/dL (ref 0–99)
Triglycerides: 68 mg/dL (ref 0–149)
VLDL Cholesterol Cal: 14 mg/dL (ref 5–40)

## 2024-06-10 LAB — HEMOGLOBIN A1C
Est. average glucose Bld gHb Est-mCnc: 126 mg/dL
Hgb A1c MFr Bld: 6 % — ABNORMAL HIGH (ref 4.8–5.6)

## 2024-06-15 ENCOUNTER — Ambulatory Visit: Payer: Self-pay | Admitting: Internal Medicine

## 2024-06-15 ENCOUNTER — Encounter: Payer: Self-pay | Admitting: Internal Medicine

## 2024-06-15 ENCOUNTER — Ambulatory Visit: Admitting: Internal Medicine

## 2024-06-15 DIAGNOSIS — R7303 Prediabetes: Secondary | ICD-10-CM

## 2024-06-15 DIAGNOSIS — I10 Essential (primary) hypertension: Secondary | ICD-10-CM | POA: Diagnosis not present

## 2024-06-15 DIAGNOSIS — E782 Mixed hyperlipidemia: Secondary | ICD-10-CM | POA: Diagnosis not present

## 2024-06-15 MED ORDER — CARVEDILOL PHOSPHATE ER 40 MG PO CP24: 40.0000 mg | ORAL_CAPSULE | Freq: Every day | ORAL | 0 refills | Status: AC

## 2024-06-15 MED ORDER — CHLORTHALIDONE 25 MG PO TABS: ORAL_TABLET | ORAL | 0 refills | Status: AC

## 2024-06-15 NOTE — Progress Notes (Signed)
 "  Established Patient Office Visit  Subjective:  Patient ID: Crystal Li, female    DOB: 1966/04/05  Age: 58 y.o. MRN: [id number]  Chief Complaint  Patient presents with   Follow-up    3 month lab results    No new complaints, here for lab review and medication refills. LDL and TC well controlled on lab review. Triglycerides also satisfactory but A1c has risen to 6 and bp is now well controlled.      No other concerns at this time.   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   Wears contact lenses     Past Surgical History:  Procedure Laterality Date   COLONOSCOPY WITH PROPOFOL  N/A 09/14/2019   Procedure: COLONOSCOPY WITH PROPOFOL ;  Surgeon: Unk Corinn Skiff, MD;  Location: [facility];  Service: Endoscopy;  Laterality: N/A;  Priority 4   COLONOSCOPY WITH PROPOFOL  N/A 04/10/2023   Procedure: COLONOSCOPY WITH PROPOFOL ;  Surgeon: Unk Corinn Skiff, MD;  Location: [facility];  Service: Gastroenterology;  Laterality: N/A;   CRYOTHERAPY  1985   ENDOMETRIAL ABLATION W/ NOVASURE  11/2009   ENDOMETRIAL BIOPSY  10/2009   hysteroscopy D&C  2011   POLYPECTOMY  09/14/2019   Procedure: POLYPECTOMY;  Surgeon: Unk Corinn Skiff, MD;  Location: [facility];  Service: Endoscopy;;   SKIN CANCER EXCISION      Social History   Socioeconomic History   Marital status: Married    Spouse name: Not on file   Number of children: Not on file   Years of education: Not on file   Highest education level: Not on file   Occupational History   Not on file  Tobacco Use   Smoking status: Never   Smokeless tobacco: Never  Vaping Use   Vaping status: Never Used  Substance and Sexual Activity   Alcohol use: Yes    Alcohol/week: 7.0 standard drinks of alcohol    Types: 7 Glasses of wine per week   Drug use: No   Sexual activity: Yes    Birth control/protection: Post-menopausal  Other Topics Concern   Not on file  Social History Narrative   Not on file   Social Drivers of Health   Tobacco Use: Low Risk (06/15/2024)   Patient History    Smoking Tobacco Use: Never    Smokeless Tobacco Use: Never    Passive Exposure: Not on file  Financial Resource Strain: Not on file  Food Insecurity: Not on file  Transportation Needs: Not on file  Physical Activity: Not on file  Stress: Not on file  Social Connections: Not on file  Intimate Partner Violence: Not on file  Depression (EYV7-0): Not on file  Alcohol Screen: Not on file  Housing: Not on file  Utilities: Not on file  Health Literacy: Not on file    Family History  Problem Relation Age of Onset   Ovarian cancer Mother 41   Hypertension Mother    Hypertension Father  Breast cancer Neg Hx     Allergies[1]  Show/hide medication list[2]  Review of Systems  Constitutional:  Negative for weight loss (3 lbs).  HENT: Negative.    Eyes: Negative.   Respiratory: Negative.    Cardiovascular: Negative.   Gastrointestinal: Negative.   Genitourinary: Negative.   Skin: Negative.   Neurological: Negative.   Endo/Heme/Allergies: Negative.        Objective:   BP 139/88   Pulse 77   Temp 98 F (36.7 C)   Ht 5' (1.524 m)   Wt 115 lb 3.2 oz (52.3 kg)   SpO2 99%   BMI 22.50 kg/m   Vitals:   06/15/24 1510  BP: 139/88  Pulse: 77  Temp: 98 F (36.7 C)  Height: 5' (1.524 m)  Weight: 115 lb 3.2 oz (52.3 kg)  SpO2: 99%  BMI (Calculated): 22.5    Physical Exam Vitals reviewed.  Constitutional:      General: She is not in acute  distress. HENT:     Head: Normocephalic.     Nose: Nose normal.     Mouth/Throat:     Mouth: Mucous membranes are moist.  Eyes:     Extraocular Movements: Extraocular movements intact.     Pupils: Pupils are equal, round, and reactive to light.  Cardiovascular:     Rate and Rhythm: Normal rate and regular rhythm.     Heart sounds: No murmur heard. Pulmonary:     Effort: Pulmonary effort is normal.     Breath sounds: No rhonchi or rales.  Abdominal:     General: Abdomen is flat.     Palpations: There is no hepatomegaly, splenomegaly or mass.  Musculoskeletal:        General: Normal range of motion.     Cervical back: Normal range of motion. No tenderness.  Skin:    General: Skin is warm and dry.  Neurological:     General: No focal deficit present.     Mental Status: She is alert and oriented to person, place, and time.     Cranial Nerves: No cranial nerve deficit.     Motor: No weakness.  Psychiatric:        Mood and Affect: Mood normal.        Behavior: Behavior normal.      No results found for any visits on 06/15/24.  Recent Results (from the past 2160 hours)  Cytology - PAP     Status: None   Collection Time: 03/30/24  8:47 AM  Result Value Ref Range   High risk HPV Negative    Adequacy      Satisfactory for evaluation; transformation zone component PRESENT.   Diagnosis      - Negative for intraepithelial lesion or malignancy (NILM)   Comment Normal Reference Range HPV - Negative   Hemoglobin A1c     Status: Abnormal   Collection Time: 06/09/24  9:21 AM  Result Value Ref Range   Hgb A1c MFr Bld 6.0 (H) 4.8 - 5.6 %    Comment:          Prediabetes: 5.7 - 6.4          Diabetes: >6.4          Glycemic control for adults with diabetes: <7.0    Est. average glucose Bld gHb Est-mCnc 126 mg/dL  Lipid panel     Status: None   Collection Time: 06/09/24  9:21 AM  Result Value Ref Range   Cholesterol, Total  146 100 - 199 mg/dL   Triglycerides 68 0 - 149 mg/dL    HDL 59 >60 mg/dL   VLDL Cholesterol Cal 14 5 - 40 mg/dL   LDL Chol Calc (NIH) 73 0 - 99 mg/dL   Chol/HDL Ratio 2.5 0.0 - 4.4 ratio    Comment:                                   T. Chol/HDL Ratio                                             Men  Women                               1/2 Avg.Risk  3.4    3.3                                   Avg.Risk  5.0    4.4                                2X Avg.Risk  9.6    7.1                                3X Avg.Risk 23.4   11.0       Assessment & Plan:  Corley was seen today for follow-up.  Primary hypertension -     Carvedilol  Phosphate ER; Take 1 capsule (40 mg total) by mouth daily.  Dispense: 90 capsule; Refill: 0  Essential (primary) hypertension -     Chlorthalidone ; TAKE 1 TABLET BY MOUTH EVERY DAY IN THE MORNING  Dispense: 90 tablet; Refill: 0  Prediabetes -     Hemoglobin A1c  Mixed hyperlipidemia -     Lipid panel    Problem List Items Addressed This Visit       Cardiovascular and Mediastinum   Essential (primary) hypertension   Relevant Medications   carvedilol  (COREG  CR) 40 MG 24 hr capsule   chlorthalidone  (HYGROTON ) 25 MG tablet     Other   Mixed hyperlipidemia   Relevant Medications   carvedilol  (COREG  CR) 40 MG 24 hr capsule   chlorthalidone  (HYGROTON ) 25 MG tablet   Prediabetes   Other Visit Diagnoses       Primary hypertension       Relevant Medications   carvedilol  (COREG  CR) 40 MG 24 hr capsule   chlorthalidone  (HYGROTON ) 25 MG tablet       Return in about 3 months (around 09/13/2024) for fu with labs prior.   Total time spent: 20 minutes. This time includes review of previous notes and results and patient face to face interaction during today'Deon Duer visit.    Sherrill Cinderella Perry, MD  06/15/2024   This document may have been prepared by [facility] Voice Recognition software and as such may include unintentional dictation errors.     [1]  Allergies Allergen Reactions   Simvastatin Anaphylaxis   [2]  Outpatient Medications Prior to Visit  Medication Sig   ALPRAZolam  (XANAX ) 0.5 MG tablet  Take 1 tablet (0.5 mg total) by mouth 2 (two) times daily as needed for anxiety.   Cholecalciferol (VITAMIN D3) 50 MCG (2000 UT) TABS Take by mouth daily.   Estradiol  10 MCG TABS vaginal tablet Place 1 tablet (10 mcg total) vaginally once a week.   NEXLIZET  180-10 MG TABS TAKE 1 TABLET BY MOUTH EVERY DAY   olmesartan  (BENICAR ) 20 MG tablet Take 1 tablet (20 mg total) by mouth daily.   [DISCONTINUED] carvedilol  (COREG  CR) 40 MG 24 hr capsule Take 1 capsule (40 mg total) by mouth daily.   [DISCONTINUED] chlorthalidone  (HYGROTON ) 25 MG tablet TAKE 1 TABLET BY MOUTH EVERY DAY IN THE MORNING   No facility-administered medications prior to visit.   "

## 2024-07-20 ENCOUNTER — Other Ambulatory Visit: Payer: Self-pay | Admitting: Internal Medicine

## 2024-07-20 DIAGNOSIS — I10 Essential (primary) hypertension: Secondary | ICD-10-CM

## 2024-09-14 ENCOUNTER — Ambulatory Visit: Admitting: Internal Medicine
